# Patient Record
Sex: Female | Born: 1965 | Race: Black or African American | Hispanic: No | Marital: Married | Smoking: Never smoker
Health system: Southern US, Community
[De-identification: ages and names within clinical notes are randomized; demographics above are authoritative.]

## PROBLEM LIST (undated history)

## (undated) DIAGNOSIS — K921 Melena: Secondary | ICD-10-CM

## (undated) DIAGNOSIS — R011 Cardiac murmur, unspecified: Secondary | ICD-10-CM

## (undated) DIAGNOSIS — D649 Anemia, unspecified: Secondary | ICD-10-CM

## (undated) DIAGNOSIS — I10 Essential (primary) hypertension: Secondary | ICD-10-CM

## (undated) DIAGNOSIS — R7303 Prediabetes: Secondary | ICD-10-CM

## (undated) DIAGNOSIS — F419 Anxiety disorder, unspecified: Secondary | ICD-10-CM

## (undated) DIAGNOSIS — E669 Obesity, unspecified: Secondary | ICD-10-CM

## (undated) DIAGNOSIS — G4700 Insomnia, unspecified: Secondary | ICD-10-CM

## (undated) DIAGNOSIS — R928 Other abnormal and inconclusive findings on diagnostic imaging of breast: Secondary | ICD-10-CM

## (undated) DIAGNOSIS — A4902 Methicillin resistant Staphylococcus aureus infection, unspecified site: Secondary | ICD-10-CM

## (undated) HISTORY — DX: Insomnia, unspecified: G47.00

## (undated) HISTORY — PX: COLONOSCOPY: SHX174

## (undated) HISTORY — DX: Essential (primary) hypertension: I10

## (undated) HISTORY — DX: Obesity, unspecified: E66.9

## (undated) HISTORY — PX: TOTAL ABDOMINAL HYSTERECTOMY: SHX209

## (undated) HISTORY — DX: Prediabetes: R73.03

---
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
## 2000-03-26 ENCOUNTER — Other Ambulatory Visit: Admission: RE | Admit: 2000-03-26 | Discharge: 2000-03-26 | Payer: Self-pay | Admitting: Obstetrics and Gynecology

## 2001-05-23 ENCOUNTER — Other Ambulatory Visit: Admission: RE | Admit: 2001-05-23 | Discharge: 2001-05-23 | Payer: Self-pay | Admitting: Obstetrics and Gynecology

## 2003-06-11 ENCOUNTER — Other Ambulatory Visit: Admission: RE | Admit: 2003-06-11 | Discharge: 2003-06-11 | Payer: Self-pay | Admitting: Obstetrics and Gynecology

## 2005-08-20 ENCOUNTER — Other Ambulatory Visit: Admission: RE | Admit: 2005-08-20 | Discharge: 2005-08-20 | Payer: Self-pay | Admitting: Obstetrics and Gynecology

## 2005-09-01 ENCOUNTER — Ambulatory Visit (HOSPITAL_COMMUNITY): Admission: RE | Admit: 2005-09-01 | Discharge: 2005-09-01 | Payer: Self-pay | Admitting: Obstetrics and Gynecology

## 2005-09-01 ENCOUNTER — Encounter (INDEPENDENT_AMBULATORY_CARE_PROVIDER_SITE_OTHER): Payer: Self-pay | Admitting: *Deleted

## 2010-06-07 ENCOUNTER — Ambulatory Visit: Payer: Self-pay | Admitting: Oncology

## 2010-06-20 LAB — CBC & DIFF AND RETIC
BASO%: 0.6 % (ref 0.0–2.0)
Basophils Absolute: 0 10*3/uL (ref 0.0–0.1)
EOS%: 0.6 % (ref 0.0–7.0)
HGB: 8 g/dL — ABNORMAL LOW (ref 11.6–15.9)
Immature Retic Fract: 16.8 % — ABNORMAL HIGH (ref 0.00–10.70)
MCH: 18.1 pg — ABNORMAL LOW (ref 25.1–34.0)
MCHC: 27.9 g/dL — ABNORMAL LOW (ref 31.5–36.0)
RBC: 4.42 10*6/uL (ref 3.70–5.45)
RDW: 20.6 % — ABNORMAL HIGH (ref 11.2–14.5)
Retic %: 0.75 % (ref 0.50–1.50)
Retic Ct Abs: 33.15 10*3/uL (ref 18.30–72.70)
lymph#: 1.8 10*3/uL (ref 0.9–3.3)

## 2010-06-20 LAB — MORPHOLOGY: PLT EST: ADEQUATE

## 2010-06-20 LAB — CHCC SMEAR

## 2010-06-22 LAB — IRON AND TIBC: UIBC: 451 ug/dL

## 2010-06-22 LAB — COMPREHENSIVE METABOLIC PANEL
ALT: <8 U/L (ref 0–35)
AST: 11 U/L (ref 0–37)
Alkaline Phosphatase: 66 U/L (ref 39–117)
BUN: 6 mg/dL (ref 6–23)
Creatinine, Ser: 0.69 mg/dL (ref 0.40–1.20)
Total Bilirubin: 0.3 mg/dL (ref 0.3–1.2)

## 2010-06-22 LAB — TRANSFERRIN RECEPTOR, SOLUABLE: Transferrin Receptor, Soluble: 94.5 nmol/L

## 2010-07-04 LAB — CBC WITH DIFFERENTIAL/PLATELET
BASO%: 0.1 % (ref 0.0–2.0)
EOS%: 0.8 % (ref 0.0–7.0)
LYMPH%: 29 % (ref 14.0–49.7)
MCH: 21.1 pg — ABNORMAL LOW (ref 25.1–34.0)
MCHC: 30.2 g/dL — ABNORMAL LOW (ref 31.5–36.0)
MONO#: 0.3 10*3/uL (ref 0.1–0.9)
NEUT%: 65.3 % (ref 38.4–76.8)
RBC: 4.73 10*6/uL (ref 3.70–5.45)
WBC: 6.1 10*3/uL (ref 3.9–10.3)
lymph#: 1.8 10*3/uL (ref 0.9–3.3)

## 2010-07-04 LAB — FERRITIN: Ferritin: 466 ng/mL — ABNORMAL HIGH (ref 10–291)

## 2010-07-17 ENCOUNTER — Encounter: Admission: RE | Admit: 2010-07-17 | Discharge: 2010-07-17 | Payer: Self-pay | Admitting: Oncology

## 2010-07-17 IMAGING — MG MM DIGITAL SCREENING
4 series · 4 of 4 positions shown · non-contrast
Comparison: none

DG SCREEN MAMMOGRAM BILATERAL
Bilateral CC and MLO view(s) were taken.

DIGITAL SCREENING MAMMOGRAM WITH CAD:
The breast tissue is heterogeneously dense.  A possible mass is noted in the left breast.  Spot 
compression views and possibly sonography are recommended for further evaluation.  In the right 
breast, no masses or malignant type calcifications are identified.
Images were processed with CAD.

[R CC]
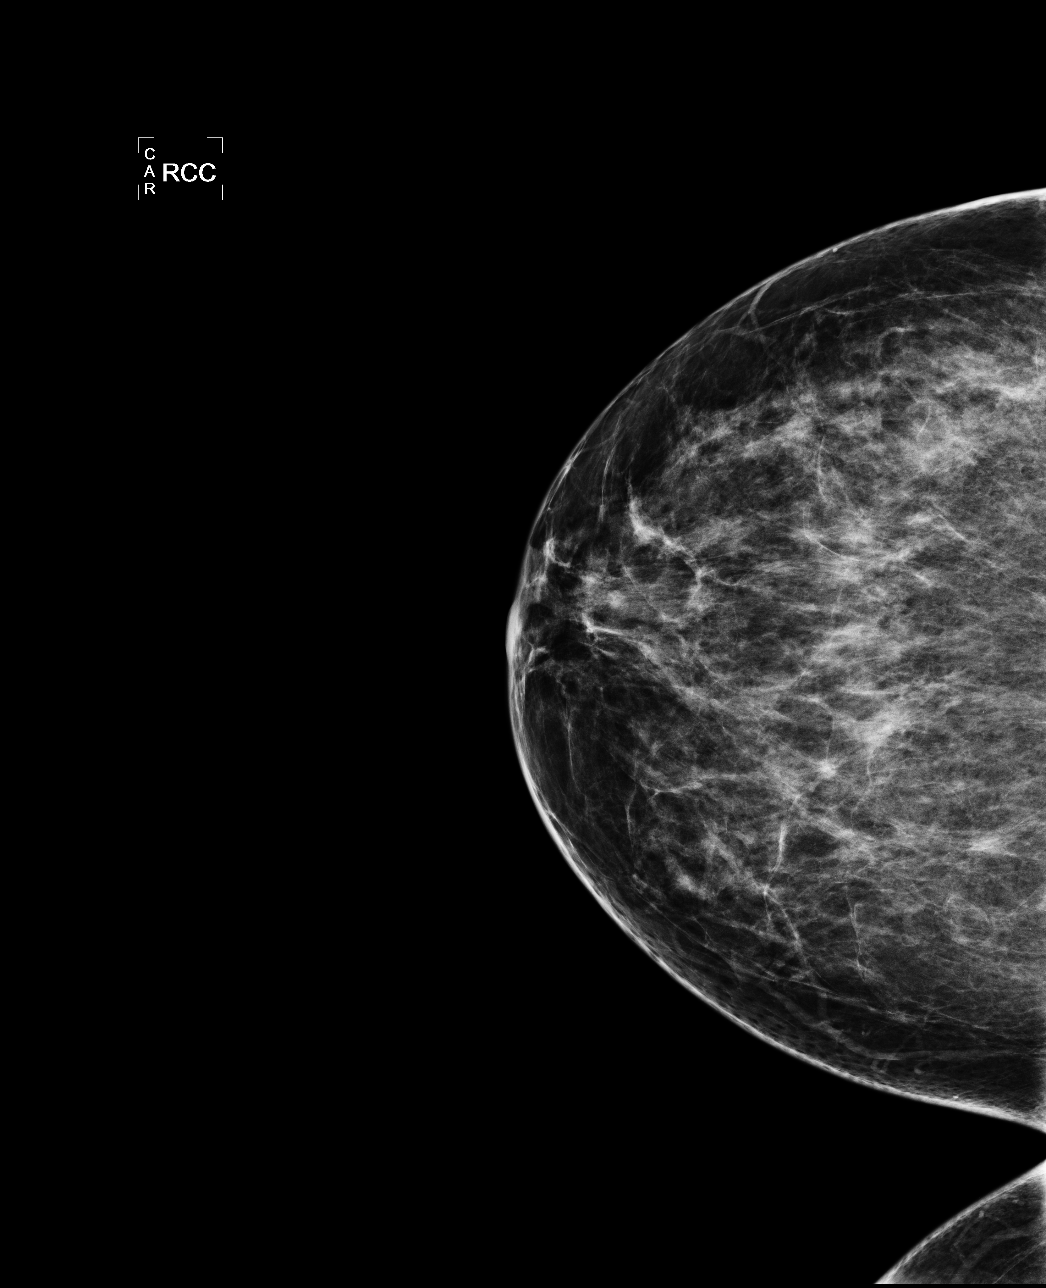

[L CC]
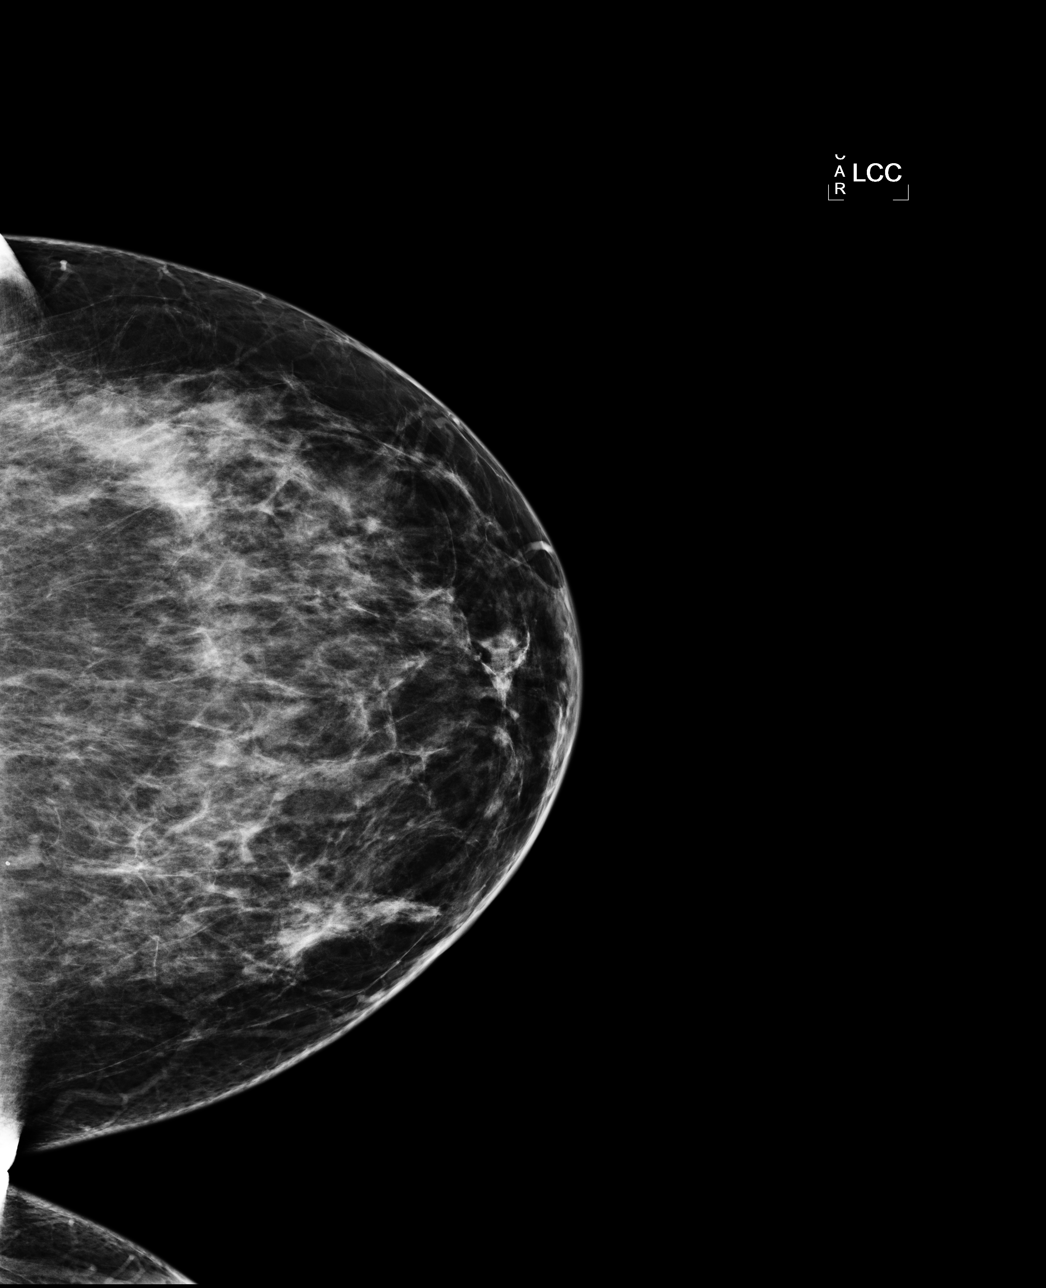

[L MLO]
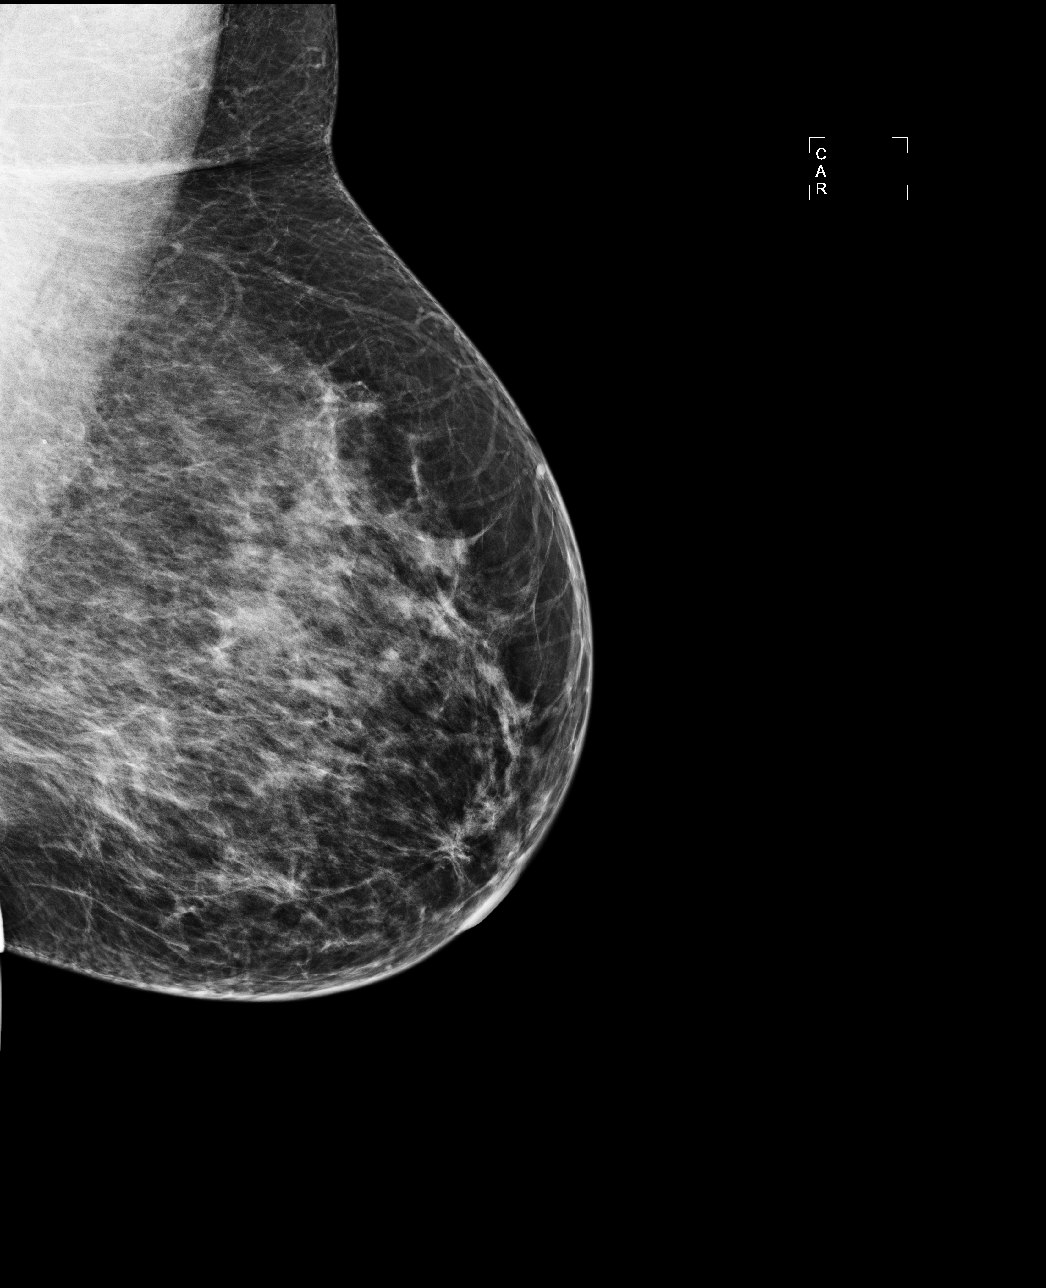

[R MLO]
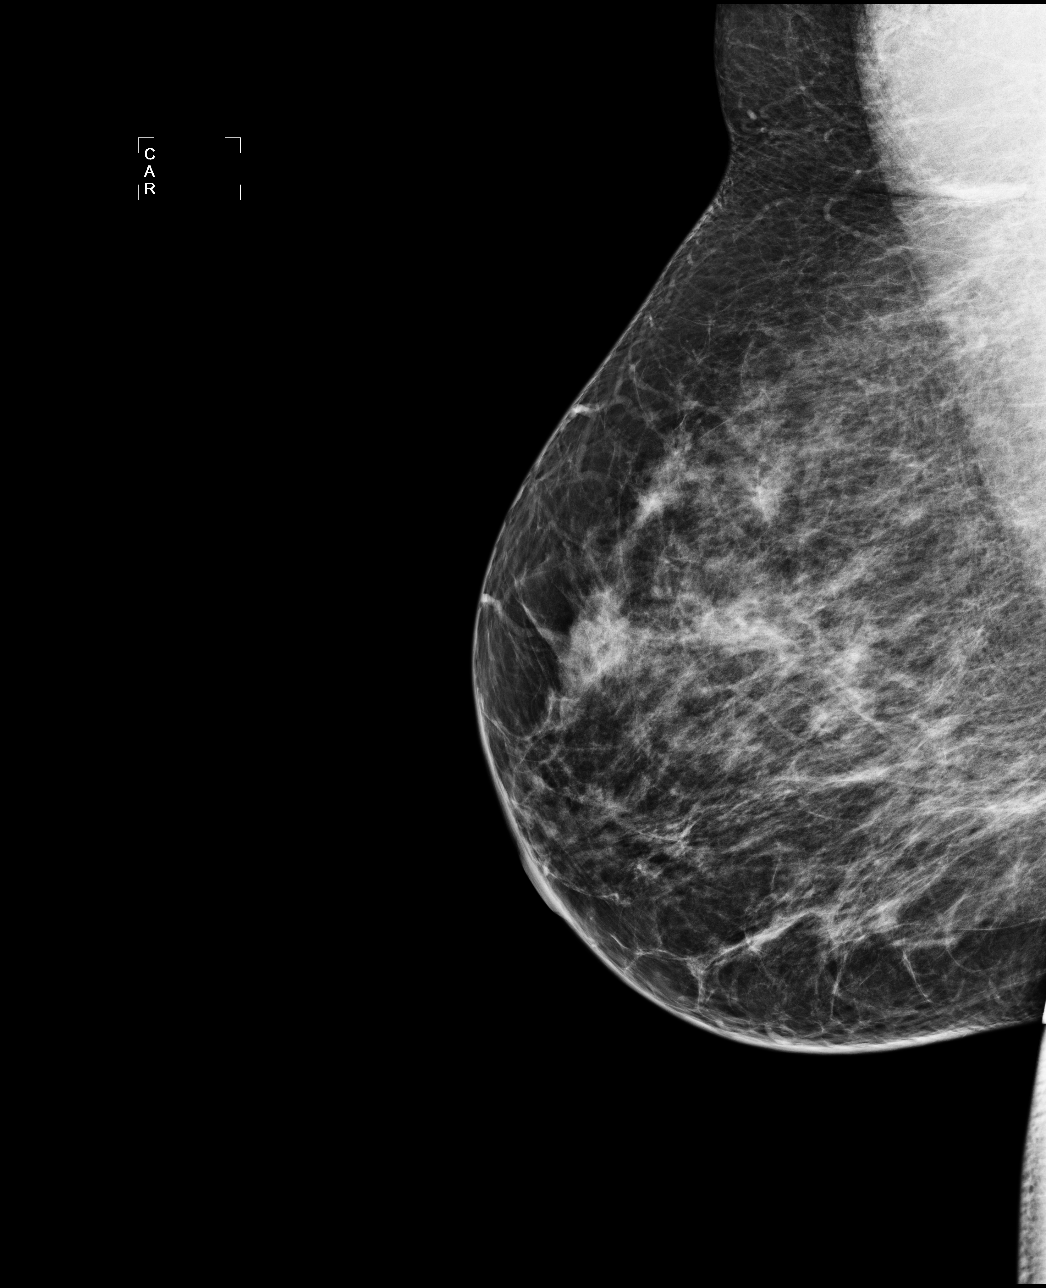

[4 of 4 positions shown; findings below may reference images not displayed]

IMPRESSION: Possible mass, left breast.  Additional evaluation is indicated.  The patient will be contacted for
additional studies and a supplementary report will follow.  No specific mammographic evidence of 
malignancy, right breast.

ASSESSMENT: Need additional imaging evaluation and/or prior mammograms for comparison - BI-RADS 0

Further imaging of the left breast.
,

## 2010-07-20 ENCOUNTER — Encounter: Admission: RE | Admit: 2010-07-20 | Discharge: 2010-07-20 | Payer: Self-pay | Admitting: Oncology

## 2010-07-21 ENCOUNTER — Ambulatory Visit: Payer: Self-pay | Admitting: Oncology

## 2010-11-26 ENCOUNTER — Encounter (HOSPITAL_COMMUNITY): Payer: Self-pay | Admitting: Oncology

## 2011-03-23 NOTE — Op Note (Signed)
NAMEOPEL, LEJEUNE            ACCOUNT NO.:  [id number]   MEDICAL RECORD NO.:  [id number]          PATIENT TYPE:  AMB   LOCATION:  SDC                           FACILITY:  [facility]   PHYSICIAN:  Janine Limbo, M.D.DATE OF BIRTH:  04/23/1966   DATE OF PROCEDURE:  09/01/2005  DATE OF DISCHARGE:                                 OPERATIVE REPORT   PREOPERATIVE DIAGNOSE:  1.  Menorrhagia.  2.  Anemia (hemoglobin is 10.8).   POSTOPERATIVE DIAGNOSES:  1.  Menorrhagia.  2.  Anemia (hemoglobin is 10.8).  3.  Endometrial polyp.   PROCEDURE:  1.  Hysteroscopy.  2.  Hysteroscopic resection of a polyp.  3.  Dilatation and curettage.   SURGEON:  Dr. Leonard Schwartz.   ANESTHETIC:  General.   DISPOSITION:  Ms. Brys is a 45 year old female, para 1-0-0-1, who presents  with the above-mentioned diagnosis. The patient understands the indications  for her surgical procedure and she accepts the risk of, but not limited to,  anesthetic complications, bleeding, infections, and possible damage to  surrounding organs. The patient has tried hormonal therapy but this has not  been successful. She does not desire hysterectomy.   FINDINGS:  The uterus is 10-12 week size. No adnexal masses were  appreciated. The endometrium and the cervix sounded to 11 cm. The patient  was found to have an endometrial polyp within the uterine cavity. Otherwise  there was lots of endometrial tissue, but no other pathology appreciated.   DESCRIPTION OF PROCEDURE:  The patient was taken to the operating room where  a general anesthetic was given. The patient's abdomen and perineum and  vagina were prepped with multiple layers of Betadine. The bladder was  drained of urine. Examination under anesthesia was performed. The patient  was sterilely draped. A paracervical block was placed using 10 mL of 0.5%  Marcaine with epinephrine. The patient was noted to have brisk bleeding from  her cervix. An endocervical  curettage was performed. The uterus sounded to  11 cm. The cervix was dilated. The diagnostic hysteroscope was inserted and  the endometrial cavity was carefully inspected. Pictures were taken. The  patient was noted to have a polyp present on the right lateral uterus. The  diagnostic hysteroscope was removed and the cervix was dilated even further.  The operative hysteroscope was then inserted. A single loop was used to  resect the polyp. Hemostasis was adequate. The cavity was then curetted  using a medium sharp curette until the cavity was felt to be clean.  Hemostasis was noted to be adequate. We felt we were ready to the end our  procedure. Instruments were removed. Examination was repeated and the uterus  was noted to be without harm. Sponge, needle, and instrument counts were  correct on two occasions. The estimated blood loss for the procedure was 20  mL. The estimated fluid deficit was 50 mL. The patient tolerated her  procedure well. She was awakened from her anesthetic and taken to the  recovery room in stable condition.   FOLLOW-UP INSTRUCTIONS:  The patient was given a prescription for Darvocet  and she will take 1 or 2 tablets every 4 hours as needed for pain. She will  return to see Dr. Stefano Gaul in 3-4 weeks for a follow-up examination. She was  given a copy of the postoperative instruction sheet as prepared by the  [facility] for patients who have undergone a dilatation  and curettage.      Janine Limbo, M.D.  Electronically Signed     AVS/MEDQ  D:  09/01/2005  T:  09/01/2005  Job:  [id number]

## 2011-03-23 NOTE — H&P (Signed)
Paula Crawford, Paula Crawford            ACCOUNT NO.:  [id number]   MEDICAL RECORD NO.:  [id number]          PATIENT TYPE:  AMB   LOCATION:  SDC                           FACILITY:  [facility]   PHYSICIAN:  Janine Limbo, M.D.DATE OF BIRTH:  Apr 30, 1966   DATE OF ADMISSION:  09/01/2005  DATE OF DISCHARGE:                                HISTORY & PHYSICAL   HISTORY OF PRESENT ILLNESS:  Paula Crawford is a 45 year old female, para 1, 0,  0, 1, who presents for hysteroscopy and dilatation and curettage. The  patient has been followed at the [facility]. The patient has a history of  menorrhagia and anemia. Her most recent hemoglobin was 10.5 (August 20, 2005). The patient has been treated with hormonal therapy but reports having  increased bleeding recently. An ultrasound was performed on August 31, 2005  that showed a 10.7 x 5.8 x 5.0 cm uterus. The patient was found to have a  thickened endometrium with a hypoechoic mass measuring 1.5 x 1.2 cm. This  was thought to be consistent with an endometrial polyp. The patient's most  recent Pap smear was within normal limits.   OBSTETRICAL HISTORY:  The patient has had one term cesarean delivery.   PAST MEDICAL HISTORY:  The patient denies hypertension and diabetes.   ALLERGIES:  No known drug allergies.   SOCIAL HISTORY:  The patient denies cigarette use, alcohol use, and  recreational drug use.   REVIEW OF SYSTEMS:  Noncontributory.   FAMILY HISTORY:  The patient's maternal grandfather has hypertension. Her  maternal grandmother had a stroke.   PHYSICAL EXAMINATION:  Weight is 188 pounds. Height is 5 feet, 1 inch.  HEENT:  Within normal limits.  CHEST:  Clear.  HEART:  Regular rate and rhythm.  BREASTS:  Are without masses.  ABDOMEN:  Nontender.  EXTREMITIES:  Within normal limits.  NEUROLOGIC EXAM:  Grossly normal.  PELVIC EXAM:  External genitalia is normal. Vagina  is normal. Cervix is  nontender. Uterus is 10-12 weeks size but difficult to examine secondary to  body habitus. Adnexa no masses. Rectovaginal exam confirms.   ASSESSMENT:  1.  Menorrhagia.  2.  Anemia.  3.  Questionable endometrial polyp.   PLAN:  The patient elects to proceed with hysteroscopy with dilation and  curettage.      Janine Limbo, M.D.  Electronically Signed     AVS/MEDQ  D:  08/31/2005  T:  08/31/2005  Job:  [id number]

## 2012-11-05 HISTORY — PX: TOTAL ABDOMINAL HYSTERECTOMY: SHX209

## 2013-04-21 ENCOUNTER — Other Ambulatory Visit: Payer: Self-pay | Admitting: Obstetrics and Gynecology

## 2013-06-17 ENCOUNTER — Other Ambulatory Visit: Payer: Self-pay | Admitting: Obstetrics and Gynecology

## 2013-06-29 ENCOUNTER — Other Ambulatory Visit: Payer: Self-pay | Admitting: Obstetrics and Gynecology

## 2013-06-29 ENCOUNTER — Encounter (HOSPITAL_COMMUNITY): Payer: Self-pay

## 2013-06-29 NOTE — H&P (Signed)
Paula Crawford is a 47 YO  female P 1-0-0-1   who  presents for hysterectomy because of symptomatic uterine fibroids, menorrhagia and anemia.  For over five years the patient has had heavy menses but in the past six months she states that her five day flow has become unbearable.  She has to change a super tampon along with two pads hourly and experiences severe back "cramping" rated at a 10/10 on a 10 point pain scale.  The patient has used heat and Midol for her pain however, relief is only to an 8/10 rating.  She denies intermenstrual bleeding, dyspareunia, vaginitis symptoms, bladder problems or changes in her bowel movements.  An endometrial biopsy in June 2014 showed superficial fragments of endometrial glands with no evidence of atypia or malignancy.  A sonohysterogram at that same time revealed: uterus- 13.8 x 10.2 x 10.0 with a subserosal  right anterior fibroid: 7.4 x 6.4 x 7.3 cm and left intramural fundal 3.2 x 3.6 x 3.9 cm, endometrium = 0.866 cm; right ovary-4.3 x 2.7 x 2.6 cm and left ovary-5.5 x 5.5 x 4.37 cm with a simple cyst - 4.4 x 4.2 x 3.9 cm. Normal labs included a comprehensive metabolic panel, THS and Hgb A1C however, the patient's hemoglobin was 9.4.  A review of both medical and surgical management options were given to the patient but after careful consideration,  she has chosen definitive therapy in the form of hysterectomy.  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 ferrous sulfate 324 (65 FE) MG TBEC Take 2 tablets by mouth daily.      . Melatonin 5 MG TABS Take 1 tablet by mouth at bedtime.       No Known Allergies  Denies sensitivity to peanuts, shellfish, soy, latex or adhesives.   ROS: Admits to wearing glasses but denies headache, vision changes, nasal congestion, dysphagia, tinnitus, dizziness, hoarseness, cough,  chest pain, shortness of breath, nausea, vomiting, diarrhea,constipation,  urinary frequency, urgency  dysuria, hematuria, vaginitis symptoms, pelvic pain, swelling of joints,easy bruising,  myalgias, arthralgias, skin rashes, unexplained weight loss and except as is mentioned in the history of present illness, patient's review of systems is otherwise negative.  Physical Exam  Bp: 126/70    R: 16   Temp.: 98.4 degrees F orally    Weight: 209 lbs.  Height: 5'1"  Neck: supple without masses or thyromegaly Lungs: clear to auscultation Heart: regular rate and rhythm Abdomen: soft, non-tender with a palpable firm mass from pelvis approximately 3 fingers above symphysis pubis and no organomegaly Pelvic:EGBUS- wnl; vagina-normal rugae; uterus-12-14 weeks size and irregular (exam limited by habitus) , cervix without lesions or motion tenderness; adnexae-no tenderness or masses Extremities:  no  clubbing, cyanosis or edema   Assesment:  Symptomatic Uterine Fibroids                        Menorrhagia                        Anemia   Disposition:  A discussion was held with patient regarding the indication for her procedure(s) along with the risks, which include but are not limited to: reaction to anesthesia, damage to adjacent organs, infection and excessive bleeding. The patient  verbalized understanding of these risks and has consented to proceed with a Total Abdominal Hysterectomy with Bilateral Salpingectomy at [facility] on July 08, 2013 at 7:30 a.m.   CSN# [id number]   Rimsha Trembley J. Lowell Guitar, PA-C  for Dr. Maris Berger. Haygood

## 2013-07-01 ENCOUNTER — Other Ambulatory Visit: Payer: Self-pay | Admitting: Obstetrics and Gynecology

## 2013-07-03 ENCOUNTER — Encounter (HOSPITAL_COMMUNITY): Payer: Self-pay

## 2013-07-03 ENCOUNTER — Encounter (HOSPITAL_COMMUNITY)
Admission: RE | Admit: 2013-07-03 | Discharge: 2013-07-03 | Disposition: A | Discharge status: Home / Self Care | Payer: BC Managed Care – PPO | Source: Ambulatory Visit | Attending: Obstetrics and Gynecology | Admitting: Obstetrics and Gynecology

## 2013-07-03 DIAGNOSIS — Z01812 Encounter for preprocedural laboratory examination: Secondary | ICD-10-CM | POA: Insufficient documentation

## 2013-07-03 DIAGNOSIS — Z01818 Encounter for other preprocedural examination: Secondary | ICD-10-CM | POA: Insufficient documentation

## 2013-07-03 HISTORY — DX: Anemia, unspecified: D64.9

## 2013-07-03 HISTORY — DX: Anxiety disorder, unspecified: F41.9

## 2013-07-03 HISTORY — DX: Cardiac murmur, unspecified: R01.1

## 2013-07-03 LAB — CBC
Hemoglobin: 10.8 g/dL — ABNORMAL LOW (ref 12.0–15.0)
MCH: 24.7 pg — ABNORMAL LOW (ref 26.0–34.0)
MCHC: 31 g/dL (ref 30.0–36.0)
Platelets: 347 10*3/uL (ref 150–400)

## 2013-07-03 NOTE — Patient Instructions (Addendum)
Your procedure is scheduled on: 07/08/2013  Enter through the Main Entrance of [facility] at: 0600am  Pick up the phone at the desk and dial [phone].  Call this number if you have problems the morning of surgery: [phone].  Remember: Do NOT eat food: after midnight 07/07/2013 Do NOT drink clear liquids after: after midnight 07/07/2013 Take these medicines the morning of surgery with a SIP OF WATER: n/a  Do NOT wear jewelry, make-up, or nail polish. Do NOT wear lotions, powders, or perfumes.  You may wear deordrant. Do NOT shave for 48 hours prior to surgery. Do NOT bring valuables to the hospital. Contacts, dentures, or bridgework may not be worn into surgery. Leave suitcase in car.  After surgery it may be brought to your room.  For patients admitted to the hospital, checkout time is 11:00 AM the day of discharge.

## 2013-07-07 MED ORDER — DEXTROSE 5 % IV SOLN
2.0000 g | INTRAVENOUS | Status: AC
  Administered 2013-07-08: 2 g via INTRAVENOUS
  Filled 2013-07-07: qty 2

## 2013-07-08 ENCOUNTER — Encounter (HOSPITAL_COMMUNITY): Payer: Self-pay | Admitting: Anesthesiology

## 2013-07-08 ENCOUNTER — Inpatient Hospital Stay (HOSPITAL_COMMUNITY)
Admission: RE | Admit: 2013-07-08 | Discharge: 2013-07-10 | DRG: 358 | Disposition: A | Discharge status: Home / Self Care | Payer: BC Managed Care – PPO | Source: Ambulatory Visit | Attending: Obstetrics and Gynecology | Admitting: Obstetrics and Gynecology

## 2013-07-08 ENCOUNTER — Inpatient Hospital Stay (HOSPITAL_COMMUNITY): Payer: BC Managed Care – PPO | Admitting: Anesthesiology

## 2013-07-08 ENCOUNTER — Encounter (HOSPITAL_COMMUNITY)
Admission: RE | Disposition: A | Discharge status: Home / Self Care | Payer: Self-pay | Source: Ambulatory Visit | Attending: Obstetrics and Gynecology

## 2013-07-08 DIAGNOSIS — M545 Low back pain, unspecified: Secondary | ICD-10-CM | POA: Diagnosis present

## 2013-07-08 DIAGNOSIS — D251 Intramural leiomyoma of uterus: Secondary | ICD-10-CM | POA: Diagnosis present

## 2013-07-08 DIAGNOSIS — R011 Cardiac murmur, unspecified: Secondary | ICD-10-CM | POA: Diagnosis present

## 2013-07-08 DIAGNOSIS — D252 Subserosal leiomyoma of uterus: Principal | ICD-10-CM | POA: Diagnosis present

## 2013-07-08 DIAGNOSIS — D500 Iron deficiency anemia secondary to blood loss (chronic): Secondary | ICD-10-CM | POA: Diagnosis present

## 2013-07-08 DIAGNOSIS — N838 Other noninflammatory disorders of ovary, fallopian tube and broad ligament: Secondary | ICD-10-CM | POA: Diagnosis present

## 2013-07-08 DIAGNOSIS — N920 Excessive and frequent menstruation with regular cycle: Secondary | ICD-10-CM | POA: Diagnosis present

## 2013-07-08 DIAGNOSIS — N83209 Unspecified ovarian cyst, unspecified side: Secondary | ICD-10-CM | POA: Diagnosis present

## 2013-07-08 DIAGNOSIS — D219 Benign neoplasm of connective and other soft tissue, unspecified: Secondary | ICD-10-CM | POA: Diagnosis present

## 2013-07-08 HISTORY — PX: ABDOMINAL HYSTERECTOMY: SHX81

## 2013-07-08 LAB — PREGNANCY, URINE: Preg Test, Ur: NEGATIVE

## 2013-07-08 SURGERY — HYSTERECTOMY, ABDOMINAL
Anesthesia: General | Site: Abdomen | Wound class: Clean Contaminated

## 2013-07-08 MED ORDER — KETOROLAC TROMETHAMINE 30 MG/ML IJ SOLN
15.0000 mg | Freq: Once | INTRAMUSCULAR | Status: AC | PRN
  Administered 2013-07-08: 30 mg via INTRAVENOUS

## 2013-07-08 MED ORDER — MIDAZOLAM HCL 2 MG/2ML IJ SOLN
INTRAMUSCULAR | Status: AC
  Filled 2013-07-08: qty 2

## 2013-07-08 MED ORDER — KETOROLAC TROMETHAMINE 30 MG/ML IJ SOLN
INTRAMUSCULAR | Status: AC
  Filled 2013-07-08: qty 1

## 2013-07-08 MED ORDER — LIDOCAINE 5 % EX PTCH
1.0000 | MEDICATED_PATCH | CUTANEOUS | Status: DC
  Administered 2013-07-08: 1 via TRANSDERMAL
  Filled 2013-07-08: qty 1

## 2013-07-08 MED ORDER — ONDANSETRON HCL 4 MG/2ML IJ SOLN
INTRAMUSCULAR | Status: AC
  Filled 2013-07-08: qty 2

## 2013-07-08 MED ORDER — MENTHOL 3 MG MT LOZG: 1.0000 | LOZENGE | OROMUCOSAL | Status: DC | PRN

## 2013-07-08 MED ORDER — LIDOCAINE HCL (CARDIAC) 20 MG/ML IV SOLN
INTRAVENOUS | Status: DC | PRN
  Administered 2013-07-08: 80 mg via INTRAVENOUS

## 2013-07-08 MED ORDER — NALOXONE HCL 0.4 MG/ML IJ SOLN: 0.4000 mg | INTRAMUSCULAR | Status: DC | PRN

## 2013-07-08 MED ORDER — LACTATED RINGERS IV SOLN
INTRAVENOUS | Status: DC
  Administered 2013-07-08 (×2): via INTRAVENOUS

## 2013-07-08 MED ORDER — HYDROMORPHONE HCL PF 1 MG/ML IJ SOLN
INTRAMUSCULAR | Status: DC | PRN
  Administered 2013-07-08: 1 mg via INTRAVENOUS

## 2013-07-08 MED ORDER — ACETAMINOPHEN 160 MG/5ML PO SOLN
ORAL | Status: AC
  Administered 2013-07-08: 975 mg via ORAL
  Filled 2013-07-08: qty 40.6

## 2013-07-08 MED ORDER — GLYCOPYRROLATE 0.2 MG/ML IJ SOLN
INTRAMUSCULAR | Status: AC
  Filled 2013-07-08: qty 1

## 2013-07-08 MED ORDER — GLYCOPYRROLATE 0.2 MG/ML IJ SOLN
INTRAMUSCULAR | Status: DC | PRN
  Administered 2013-07-08: 0.1 mg via INTRAVENOUS
  Administered 2013-07-08: .8 mg via INTRAVENOUS

## 2013-07-08 MED ORDER — ACETAMINOPHEN 160 MG/5ML PO SOLN
975.0000 mg | Freq: Once | ORAL | Status: AC
  Administered 2013-07-08: 975 mg via ORAL

## 2013-07-08 MED ORDER — BUPIVACAINE HCL (PF) 0.25 % IJ SOLN
INTRAMUSCULAR | Status: AC
  Filled 2013-07-08: qty 30

## 2013-07-08 MED ORDER — ARTIFICIAL TEARS OP OINT
TOPICAL_OINTMENT | OPHTHALMIC | Status: AC
  Filled 2013-07-08: qty 3.5

## 2013-07-08 MED ORDER — HYDROMORPHONE HCL PF 1 MG/ML IJ SOLN
INTRAMUSCULAR | Status: AC
  Filled 2013-07-08: qty 1

## 2013-07-08 MED ORDER — FENTANYL CITRATE 0.05 MG/ML IJ SOLN
INTRAMUSCULAR | Status: AC
  Filled 2013-07-08: qty 5

## 2013-07-08 MED ORDER — METOCLOPRAMIDE HCL 5 MG/ML IJ SOLN: 10.0000 mg | Freq: Once | INTRAMUSCULAR | Status: DC | PRN

## 2013-07-08 MED ORDER — PROPOFOL 10 MG/ML IV BOLUS
INTRAVENOUS | Status: DC | PRN
  Administered 2013-07-08: 200 mg via INTRAVENOUS

## 2013-07-08 MED ORDER — ONDANSETRON HCL 4 MG PO TABS
4.0000 mg | ORAL_TABLET | Freq: Three times a day (TID) | ORAL | Status: DC | PRN
  Administered 2013-07-09 – 2013-07-10 (×2): 4 mg via ORAL
  Filled 2013-07-08 (×2): qty 1

## 2013-07-08 MED ORDER — NEOSTIGMINE METHYLSULFATE 1 MG/ML IJ SOLN
INTRAMUSCULAR | Status: DC | PRN
  Administered 2013-07-08: 4 mg via INTRAVENOUS

## 2013-07-08 MED ORDER — ONDANSETRON HCL 4 MG/2ML IJ SOLN: 4.0000 mg | Freq: Four times a day (QID) | INTRAMUSCULAR | Status: DC | PRN

## 2013-07-08 MED ORDER — NEOSTIGMINE METHYLSULFATE 1 MG/ML IJ SOLN
INTRAMUSCULAR | Status: AC
  Filled 2013-07-08: qty 1

## 2013-07-08 MED ORDER — IBUPROFEN 600 MG PO TABS
600.0000 mg | ORAL_TABLET | Freq: Four times a day (QID) | ORAL | Status: DC | PRN
  Administered 2013-07-08: 600 mg via ORAL
  Filled 2013-07-08 (×2): qty 1

## 2013-07-08 MED ORDER — BUPIVACAINE HCL (PF) 0.25 % IJ SOLN
INTRAMUSCULAR | Status: DC | PRN
  Administered 2013-07-08: 10 mL

## 2013-07-08 MED ORDER — DIPHENHYDRAMINE HCL 12.5 MG/5ML PO ELIX: 12.5000 mg | ORAL_SOLUTION | Freq: Four times a day (QID) | ORAL | Status: DC | PRN

## 2013-07-08 MED ORDER — HYDROMORPHONE HCL PF 1 MG/ML IJ SOLN
0.2500 mg | INTRAMUSCULAR | Status: DC | PRN
  Administered 2013-07-08 (×2): 0.5 mg via INTRAVENOUS

## 2013-07-08 MED ORDER — SODIUM CHLORIDE 0.9 % IJ SOLN: 9.0000 mL | INTRAMUSCULAR | Status: DC | PRN

## 2013-07-08 MED ORDER — ROCURONIUM BROMIDE 100 MG/10ML IV SOLN
INTRAVENOUS | Status: DC | PRN
  Administered 2013-07-08: 50 mg via INTRAVENOUS
  Administered 2013-07-08 (×2): 10 mg via INTRAVENOUS
  Administered 2013-07-08 (×4): 5 mg via INTRAVENOUS

## 2013-07-08 MED ORDER — OXYCODONE-ACETAMINOPHEN 5-325 MG PO TABS
1.0000 | ORAL_TABLET | ORAL | Status: DC | PRN
Start: 2013-07-08 — End: 2013-07-10
  Administered 2013-07-09 (×4): 2 via ORAL
  Filled 2013-07-08 (×4): qty 2

## 2013-07-08 MED ORDER — ONDANSETRON HCL 4 MG/2ML IJ SOLN
INTRAMUSCULAR | Status: DC | PRN
  Administered 2013-07-08: 4 mg via INTRAVENOUS

## 2013-07-08 MED ORDER — HYDROMORPHONE 0.3 MG/ML IV SOLN
INTRAVENOUS | Status: DC
  Administered 2013-07-08: 6 mg via INTRAVENOUS
  Administered 2013-07-08: 13:00:00 via INTRAVENOUS
  Filled 2013-07-08: qty 25

## 2013-07-08 MED ORDER — LIDOCAINE HCL (CARDIAC) 20 MG/ML IV SOLN
INTRAVENOUS | Status: AC
  Filled 2013-07-08: qty 5

## 2013-07-08 MED ORDER — FENTANYL CITRATE 0.05 MG/ML IJ SOLN
INTRAMUSCULAR | Status: DC | PRN
  Administered 2013-07-08: 50 ug via INTRAVENOUS
  Administered 2013-07-08: 100 ug via INTRAVENOUS
  Administered 2013-07-08 (×7): 50 ug via INTRAVENOUS

## 2013-07-08 MED ORDER — DEXAMETHASONE SODIUM PHOSPHATE 10 MG/ML IJ SOLN
INTRAMUSCULAR | Status: DC | PRN
  Administered 2013-07-08: 10 mg via INTRAVENOUS

## 2013-07-08 MED ORDER — PROPOFOL 10 MG/ML IV EMUL
INTRAVENOUS | Status: AC
  Filled 2013-07-08: qty 20

## 2013-07-08 MED ORDER — MIDAZOLAM HCL 2 MG/2ML IJ SOLN
INTRAMUSCULAR | Status: DC | PRN
  Administered 2013-07-08 (×2): 1 mg via INTRAVENOUS

## 2013-07-08 MED ORDER — DIPHENHYDRAMINE HCL 50 MG/ML IJ SOLN: 12.5000 mg | Freq: Four times a day (QID) | INTRAMUSCULAR | Status: DC | PRN

## 2013-07-08 MED ORDER — ROCURONIUM BROMIDE 50 MG/5ML IV SOLN
INTRAVENOUS | Status: AC
  Filled 2013-07-08: qty 1

## 2013-07-08 MED ORDER — DEXAMETHASONE SODIUM PHOSPHATE 10 MG/ML IJ SOLN
INTRAMUSCULAR | Status: AC
  Filled 2013-07-08: qty 1

## 2013-07-08 MED ORDER — LACTATED RINGERS IV SOLN
INTRAVENOUS | Status: DC
  Administered 2013-07-08 (×3): via INTRAVENOUS

## 2013-07-08 MED ORDER — GLYCOPYRROLATE 0.2 MG/ML IJ SOLN
INTRAMUSCULAR | Status: AC
  Filled 2013-07-08: qty 3

## 2013-07-08 SURGICAL SUPPLY — 50 items
ADH SKN CLS APL DERMABOND .7 (GAUZE/BANDAGES/DRESSINGS)
CANISTER SUCTION 2500CC (MISCELLANEOUS) ×2 IMPLANT
CATH ROBINSON RED A/P 16FR (CATHETERS) IMPLANT
CHLORAPREP W/TINT 26ML (MISCELLANEOUS) ×2 IMPLANT
CLOTH BEACON ORANGE TIMEOUT ST (SAFETY) ×2 IMPLANT
CONT PATH 16OZ SNAP LID 3702 (MISCELLANEOUS) ×2 IMPLANT
CONT SPECI 4OZ STER CLIK (MISCELLANEOUS) IMPLANT
DECANTER SPIKE VIAL GLASS SM (MISCELLANEOUS) ×1 IMPLANT
DERMABOND ADVANCED (GAUZE/BANDAGES/DRESSINGS)
DERMABOND ADVANCED .7 DNX12 (GAUZE/BANDAGES/DRESSINGS) IMPLANT
DRAIN JACKSON PRT FLT 7MM (DRAIN) IMPLANT
DRSG OPSITE POSTOP 4X10 (GAUZE/BANDAGES/DRESSINGS) ×1 IMPLANT
ELECT CAUTERY BLADE 6.4 (BLADE) IMPLANT
ELECT NDL TIP 2.8 STRL (NEEDLE) IMPLANT
ELECT NEEDLE TIP 2.8 STRL (NEEDLE) IMPLANT
EVACUATOR SILICONE 100CC (DRAIN) IMPLANT
GAUZE SPONGE 4X4 16PLY XRAY LF (GAUZE/BANDAGES/DRESSINGS) ×2 IMPLANT
GLOVE SURG SS PI 6.5 STRL IVOR (GLOVE) ×4 IMPLANT
GOWN PREVENTION PLUS LG XLONG (DISPOSABLE) ×6 IMPLANT
NDL HYPO 25X1 1.5 SAFETY (NEEDLE) IMPLANT
NDL SPNL 22GX3.5 QUINCKE BK (NEEDLE) ×1 IMPLANT
NEEDLE HYPO 25X1 1.5 SAFETY (NEEDLE) IMPLANT
NEEDLE SPNL 22GX3.5 QUINCKE BK (NEEDLE) ×2 IMPLANT
NS IRRIG 1000ML POUR BTL (IV SOLUTION) ×2 IMPLANT
PACK ABDOMINAL GYN (CUSTOM PROCEDURE TRAY) ×2 IMPLANT
PAD ABD 7.5X8 STRL (GAUZE/BANDAGES/DRESSINGS) ×1 IMPLANT
PAD OB MATERNITY 4.3X12.25 (PERSONAL CARE ITEMS) ×2 IMPLANT
PROTECTOR NERVE ULNAR (MISCELLANEOUS) ×2 IMPLANT
SPONGE LAP 18X18 X RAY DECT (DISPOSABLE) ×4 IMPLANT
STAPLER VISISTAT 35W (STAPLE) IMPLANT
SUT MNCRL AB 3-0 PS2 27 (SUTURE) ×1 IMPLANT
SUT PDS AB 1 CT  36 (SUTURE)
SUT PDS AB 1 CT 36 (SUTURE) IMPLANT
SUT PLAIN 2 0 XLH (SUTURE) IMPLANT
SUT SILK 0 FSL (SUTURE) IMPLANT
SUT VIC AB 0 CT1 18XCR BRD8 (SUTURE) ×3 IMPLANT
SUT VIC AB 0 CT1 27 (SUTURE) ×6
SUT VIC AB 0 CT1 27XBRD ANBCTR (SUTURE) IMPLANT
SUT VIC AB 0 CT1 8-18 (SUTURE) ×8
SUT VIC AB 2-0 CT1 (SUTURE) ×2 IMPLANT
SUT VIC AB 3-0 SH 27 (SUTURE)
SUT VIC AB 3-0 SH 27X BRD (SUTURE) IMPLANT
SUT VICRYL 0 TIES 12 18 (SUTURE) ×2 IMPLANT
SYR 50ML LL SCALE MARK (SYRINGE) IMPLANT
SYR CONTROL 10ML LL (SYRINGE) ×1 IMPLANT
SYR TB 1ML 25GX5/8 (SYRINGE) IMPLANT
TAPE CLOTH SURG 4X10 WHT LF (GAUZE/BANDAGES/DRESSINGS) ×1 IMPLANT
TOWEL OR 17X24 6PK STRL BLUE (TOWEL DISPOSABLE) ×4 IMPLANT
TRAY FOLEY CATH 14FR (SET/KITS/TRAYS/PACK) ×2 IMPLANT
WATER STERILE IRR 1000ML POUR (IV SOLUTION) ×2 IMPLANT

## 2013-07-08 NOTE — Op Note (Signed)
07/08/2013  11:56 AM  PATIENT:  Paula Crawford  47 y.o. female MRN:  [id number]  PRE-OPERATIVE DIAGNOSIS:  Firboids, Anemia, Menorrhagia, status post 2 cesarean sections  POST-OPERATIVE DIAGNOSIS:  Firboids, Anemia, Menorrhagia, status post 2 cesarean sections, and if he can't  PROCEDURE:  Procedure(s): Total abdominal hysterectomy Bilateral salpingectomy  SURGEON:  Surgeon(s): Hal Morales, MD  ASSISTANTS: Henreitta Leber certified physician assistant   ANESTHESIA:  General  ESTIMATED BLOOD LOSS: 300 cc  COMPLICATIONS: None  BLOOD ADMINISTERED:none  DRAINS: none   LOCAL MEDICATIONS USED:  BUPIVICAINE   SPECIMEN:  Source of Specimen:  Uterine fundus with tubes attached bilaterally, uterine cervix  DISPOSITION OF SPECIMEN:  PATHOLOGY  COUNTS:  YES  PROCEDURE: The patient was taken to the operating room after appropriate identification and placed on the operating table in the supine position. Equipment for the induction of general anesthesia was placed and after the attainment of adequate general anesthesia the  perineum, and vagina were prepped with multiple layers of Betadine and a Foley catheter was inserted into the bladder under sterile conditions and connected to straight drainage. The abdomen was prepped with chlor prep, which was allowed to drive for 3 minutes, then the abdomen was draped as a sterile field.  Suprapubic injection of 10 cc of quarter percent Marcaine was undertaken. A suprapubic incision was made and the abdomen opened in layers. Peritoneum was entered and a self-retaining retractor placed in the abdominal cavity. A bladder blade was placed. After visual and palpable intraperitoneal evaluation of the anatomy was carried, the bowel was packed cephalad.  Large Kelly clamps were placed at the cornual regions of the uterus. The left round ligament was then identified clamped cut and suture ligated. That incision was taken anteriorly on the anterior leaf of  the broad ligament. The ovarian attachment to the fallopian tube was clamped, cut, and suture ligated. The utero-ovarian ligament and remaining mesial salpinx were  identified clamped cut, free tied and suture-ligated. A similar procedure was carried out on the opposite side with the round ligament, fallopian tube,  and utero-ovarian ligament. The bladder flap was completed on the opposite side with incision of the anterior leaf of the broad ligament. The bladder was dissected off the anterior cervix with a combination of blunt and sharp dissection. The uterine arteries on the right and left side were skeletonized, then clamped cut and suture ligated. The uterine fundus was then excised from the cervix.  Meticulous dissection of the bladder off the anterior cervix was then undertaken down to the level of the cervicovaginal junction. Care was taken to insure that the dissection was at the level of the pubo- cervical fascia to avoid bladder injury.. The para-cervical tissues were then clamped cut and suture ligated on the right and left sides. Uterosacral ligaments were clamped cut suture-ligated and those sutures held. The vaginal angles were clamped cut suture ligated and the sutures held. The remainder of the vagina was incised and the  cervix were removed from the operative field. Vaginal cuff was closed with figure-of-eight suturres.  All sutures used tot this point were 0 Vicryl.. Copious irrigation was carried out. The sutures holding the vaginal angles and uterosacral ligaments were then tied together. Hemostasis was noted to be adequate and all instruments were removed from the peritoneal cavity.  The abdominal peritoneum was closed using running suture of 2-0 Vicryl.  The rectus fascia was closed with running sutures of 0 Vicryl from each apex to  the midline and tied in  the midline. The subcutaneous tissue was made hemostatic with Bovie cautery and irrigated. The skin incision was closed with a  subcuticular suture of 3-0 Monocryl. Sterile dressing was applied. The patient was awakened from general anesthesia and taken to the recovery room in satisfactory condition having tolerated the procedure well with  sponge and instrument counts correct.  PLAN OF CARE: Postoperative admission  PATIENT DISPOSITION:  PACU - hemodynamically stable.   Delay start of Pharmacological VTE agent (>24hrs) due to surgical blood loss or risk of bleeding:  SCD hose were used throughout the surgery and will be continued postoperatively.   Ashtan Girtman P 11:56 AM

## 2013-07-08 NOTE — H&P (Signed)
  History and Physical Interval Note:   07/08/2013   7:15 AM   Paula Crawford  has presented today for surgery, with the diagnosis of Firboids, Anemia, Menorrhagia  The various methods of treatment have been discussed with the patient and family. After consideration of risks, benefits and other options for treatment, the patient has consented to  Procedure(s): HYSTERECTOMY ABDOMINAL as a surgical intervention .  I have examined the patient, reviewed the patients' chart and labs. She had her LMP on8/26/14.  Questions were answered to the patient's satisfaction.     Hal Morales  MD

## 2013-07-08 NOTE — Progress Notes (Signed)
Day of Surgery Procedure(s) (LRB): HYSTERECTOMY ABDOMINAL BILATERAL SALPINGECTOMY  Subjective: Patient reports no nausea and vomiting.  Has sensation in right eye like something's in it.  Also c/o central low back pain  Objective: I have reviewed patient's vital signs, intake and output and medications. BP 154/91  Pulse 68  Temp(Src) 98.7 F (37.1 C) (Oral)  Resp 16  SpO2 100%   Total I/O In: 3500 [I.V.:3500] Out: 1875 [Urine:1500; Blood:375]   General: cooperative, appears stated age and mild distress Resp: clear to auscultation bilaterally.  No CVAT Cardio: regular rate and rhythm, S1, S2 normal, no murmur, click, rub or gallop GI: soft, non-tender; bowel sounds normal; no masses,  no organomegaly and incision: dressing dry Extremities: extremities normal, atraumatic, no cyanosis or edema Vaginal Bleeding: none  Assessment: s/p Procedure(s): HYSTERECTOMY ABDOMINAL (N/A): stable Back pain was a problem prior to surgery Plan: Advance diet Encourage ambulation Advance to PO medication Lidocaine patch for back.. Consult anesthesia re eye care.  LOS: 0 days    Paula Crawford P 07/08/2013, 4:58 PM

## 2013-07-08 NOTE — Anesthesia Postprocedure Evaluation (Signed)
  Anesthesia Post-op Note  Patient: Paula Crawford  Procedure(s) Performed: Procedure(s): HYSTERECTOMY ABDOMINAL (N/A)  Patient Location: PACU  Anesthesia Type:General  Level of Consciousness: awake, alert  and oriented  Airway and Oxygen Therapy: Patient Spontanous Breathing  Post-op Pain: mild  Post-op Assessment: Post-op Vital signs reviewed, Patient's Cardiovascular Status Stable, Respiratory Function Stable, Patent Airway, No signs of Nausea or vomiting and Pain level controlled  Post-op Vital Signs: Reviewed and stable  Complications: No apparent anesthesia complications

## 2013-07-08 NOTE — Anesthesia Procedure Notes (Signed)
Procedure Name: Intubation Date/Time: 07/08/2013 7:38 AM Performed by: Graciela Husbands Pre-anesthesia Checklist: Suction available, Emergency Drugs available, Timeout performed, Patient being monitored and Patient identified Patient Re-evaluated:Patient Re-evaluated prior to inductionOxygen Delivery Method: Circle system utilized Preoxygenation: Pre-oxygenation with 100% oxygen Intubation Type: IV induction Ventilation: Mask ventilation with difficulty and Oral airway inserted - appropriate to patient size Laryngoscope Size: Mac and 3 Grade View: Grade I Laser Tube: Cuffed inflated with minimal occlusive pressure - saline Tube size: 7.0 mm Number of attempts: 1 Airway Equipment and Method: Patient positioned with wedge pillow and Stylet Placement Confirmation: ETT inserted through vocal cords under direct vision,  breath sounds checked- equal and bilateral and positive ETCO2 Secured at: 21 cm Tube secured with: Tape Dental Injury: Teeth and Oropharynx as per pre-operative assessment  Difficulty Due To: Difficulty was unanticipated

## 2013-07-08 NOTE — Anesthesia Preprocedure Evaluation (Addendum)
Anesthesia Evaluation  Patient identified by MRN, date of birth, ID band Patient awake    Reviewed: Allergy & Precautions, H&P , NPO status , Patient's Chart, lab work & pertinent test results, reviewed documented beta blocker date and time   History of Anesthesia Complications Negative for: history of anesthetic complications  Airway Mallampati: I TM Distance: >3 FB Neck ROM: full    Dental  (+) Teeth Intact,    Pulmonary neg pulmonary ROS,  breath sounds clear to auscultation  Pulmonary exam normal       Cardiovascular Exercise Tolerance: Good + Valvular Problems/Murmurs (tricuspid regurg - diagnosed in 8/14) Rhythm:regular Rate:Normal - Systolic murmurs    Neuro/Psych  Headaches (monthly headaches), negative psych ROS   GI/Hepatic negative GI ROS, Neg liver ROS,   Endo/Other  Obese - BMI 38.2  Renal/GU   Female GU complaint     Musculoskeletal   Abdominal   Peds  Hematology  (+) anemia ,   Anesthesia Other Findings   Reproductive/Obstetrics negative OB ROS                          Anesthesia Physical Anesthesia Plan  ASA: II  Anesthesia Plan: General ETT   Post-op Pain Management:    Induction:   Airway Management Planned:   Additional Equipment:   Intra-op Plan:   Post-operative Plan:   Informed Consent: I have reviewed the patients History and Physical, chart, labs and discussed the procedure including the risks, benefits and alternatives for the proposed anesthesia with the patient or authorized representative who has indicated his/her understanding and acceptance.   Dental Advisory Given  Plan Discussed with: CRNA and Surgeon  Anesthesia Plan Comments:         Anesthesia Quick Evaluation

## 2013-07-08 NOTE — H&P (Signed)
Paula Crawford is a 47 YO female P 1-0-0-1 who presents for hysterectomy because of symptomatic uterine fibroids, menorrhagia and anemia. For over five years the patient has had heavy menses but in the past six months she states that her five day flow has become unbearable. She has to change a super tampon along with two pads hourly and experiences severe back "cramping" rated at a 10/10 on a 10 point pain scale. The patient has used heat and Midol for her pain however, relief is only to an 8/10 rating. She denies intermenstrual bleeding, dyspareunia, vaginitis symptoms, bladder problems or changes in her bowel movements. An endometrial biopsy in June 2014 showed superficial fragments of endometrial glands with no evidence of atypia or malignancy. A sonohysterogram at that same time revealed: uterus- 13.8 x 10.2 x 10.0 with a subserosal right anterior fibroid: 7.4 x 6.4 x 7.3 cm and left intramural fundal 3.2 x 3.6 x 3.9 cm, endometrium = 0.866 cm; right ovary-4.3 x 2.7 x 2.6 cm and left ovary-5.5 x 5.5 x 4.37 cm with a simple cyst - 4.4 x 4.2 x 3.9 cm. Normal labs included a comprehensive metabolic panel, TSH and Hgb A1C however, the patient's hemoglobin was 9.4. A review of both medical and surgical management options were given to the patient but after careful consideration, she has chosen definitive therapy in the form of hysterectomy.   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  ferrous sulfate 324 (65 FE) MG TBEC  Take 2 tablets by mouth daily.     .  Melatonin 5 MG TABS  Take 1 tablet by mouth at bedtime.      No Known Allergies   Denies sensitivity to peanuts, shellfish, soy, latex or adhesives.   ROS: Admits to wearing glasses but denies headache, vision changes, nasal congestion, dysphagia, tinnitus, dizziness, hoarseness, cough, chest pain, shortness of breath, nausea, vomiting, diarrhea,constipation, urinary frequency, urgency dysuria, hematuria, vaginitis symptoms, pelvic pain, swelling of joints,easy bruising, myalgias, arthralgias, skin rashes, unexplained weight loss and except as is mentioned in the history of present illness, patient's review of systems is otherwise negative.   Physical Exam   Bp: 126/70 R: 16 Temp.: 98.4 degrees F orally Weight: 209 lbs. Height: 5'1"   Neck: supple without masses or thyromegaly  Lungs: clear to auscultation  Heart: regular rate and rhythm  Abdomen: soft, non-tender with a palpable firm mass from pelvis approximately 3 fingers above symphysis pubis and no organomegaly  Pelvic:EGBUS- wnl; vagina-normal rugae; uterus-12-14 weeks size and irregular (exam limited by habitus) , cervix without lesions or motion tenderness; adnexae-no tenderness or masses  Extremities: no clubbing, cyanosis or edema   Assesment: Symptomatic Uterine Fibroids  Menorrhagia                       Anemia   Disposition: A discussion was held with patient regarding the indication for her procedure(s) along with the risks, which include but are not limited to: reaction to anesthesia, damage to adjacent organs, infection and excessive bleeding. The patient verbalized understanding of  these risks and has consented to proceed with a Total Abdominal Hysterectomy with Bilateral Salpingectomy at [facility] [place] on July 08, 2013 at 7:30 a.m.    CSN# [id number]  Herby Amick J. Lowell Guitar, PA-C for Dr. Maris Berger. Haygood

## 2013-07-08 NOTE — Transfer of Care (Signed)
Immediate Anesthesia Transfer of Care Note  Patient: Paula Crawford  Procedure(s) Performed: Procedure(s): HYSTERECTOMY ABDOMINAL (N/A)  Patient Location: PACU  Anesthesia Type:General  Level of Consciousness: awake, alert  and oriented  Airway & Oxygen Therapy: Patient Spontanous Breathing and Patient connected to nasal cannula oxygen  Post-op Assessment: Report given to PACU RN and Post -op Vital signs reviewed and stable  Post vital signs: Reviewed and stable  Complications: No apparent anesthesia complications

## 2013-07-08 NOTE — Anesthesia Postprocedure Evaluation (Signed)
  Anesthesia Post-op Note  Patient: Paula Crawford  Procedure(s) Performed: Procedure(s): HYSTERECTOMY ABDOMINAL (N/A)  Patient Location: [facility]  Anesthesia Type:General  Level of Consciousness: awake, alert  and oriented  Airway and Oxygen Therapy: Patient Spontanous Breathing  Post-op Pain: mild  Post-op Assessment: Post-op Vital signs reviewed and Patient's Cardiovascular Status Stable  Post-op Vital Signs: Reviewed and stable  Complications: No apparent anesthesia complications

## 2013-07-09 ENCOUNTER — Encounter (HOSPITAL_COMMUNITY): Payer: Self-pay | Admitting: Obstetrics and Gynecology

## 2013-07-09 ENCOUNTER — Other Ambulatory Visit: Payer: Self-pay | Admitting: Obstetrics and Gynecology

## 2013-07-09 LAB — CBC
HCT: 30.9 % — ABNORMAL LOW (ref 36.0–46.0)
Hemoglobin: 9.6 g/dL — ABNORMAL LOW (ref 12.0–15.0)
MCH: 24.4 pg — ABNORMAL LOW (ref 26.0–34.0)
MCHC: 31.1 g/dL (ref 30.0–36.0)

## 2013-07-09 MED ORDER — GLYCERIN (LAXATIVE) 2.1 G RE SUPP
1.0000 | Freq: Four times a day (QID) | RECTAL | Status: DC | PRN
  Administered 2013-07-09: 1 via RECTAL
  Filled 2013-07-09: qty 1

## 2013-07-09 MED ORDER — OXYCODONE-ACETAMINOPHEN 5-325 MG PO TABS: 1.0000 | ORAL_TABLET | ORAL | Status: DC | PRN

## 2013-07-09 MED ORDER — CYCLOBENZAPRINE HCL 5 MG PO TABS
5.0000 mg | ORAL_TABLET | Freq: Three times a day (TID) | ORAL | Status: DC | PRN
  Filled 2013-07-09: qty 1

## 2013-07-09 NOTE — Progress Notes (Signed)
Paula Crawford is a41 y.o.  [id number]  Post Op Date # 1; TAH/BS  Subjective: Patient is complaining of gas pains.  Has ambulated and voided without difficulty.  Admits to vomiting some fruiit she ate earlier but is tolerating liquids.  Has yet to pass flatus but has been belching. Patient has Reports surgical pain is minimal but gas and back pains are the most uncomforable.,   Objective: Vital signs in last 24 hours: Temp:  [97.3 F (36.3 C)-98.6 F (37 C)] 97.3 F (36.3 C) (09/04 1408) Pulse Rate:  [64-94] 76 (09/04 1408) Resp:  [18-20] 18 (09/04 1408) BP: (136-182)/(82-97) 182/97 mmHg (09/04 1408) SpO2:  [97 %-100 %] 97 % (09/04 1408) Weight:  [202 lb (91.627 kg)] 202 lb (91.627 kg) (09/03 1901)  Intake/Output from previous day: 09/03 0701 - 09/04 0700 In: 5054.1 [I.V.:5054.1] Out: 5675 [Urine:5300] Intake/Output this shift: Total I/O In: -  Out: 500 [Urine:300; Emesis/NG output:200]  Recent Labs Lab 07/03/13 0910 07/09/13 0754  WBC 4.3 9.8  HGB 10.8* 9.6*  HCT 34.8* 30.9*  PLT 347 355    No results found for this basename: NA, K, CL, CO2, BUN, CREATININE, CALCIUM, LABALBU, PROT, BILITOT, ALKPHOS, ALT, AST, GLUCOSE,  in the last 168 hours  EXAM: General: Flat affect with monotone speech, appears mildly distressed by gas pains Resp: clear to auscultation bilaterally Cardio: regular rate and rhythm, S1, S2 normal, no murmur, click, rub or gallop GI: Soft and not distended, bandage dressing clean, dry, and intact Extremities: No calf tenderness and negative Homan's   Assessment: s/p Procedure(s): Bilateral Salpingectomy HYSTERECTOMY ABDOMINAL: stable, anemia and Gas Pains  Plan: Glycerin Suppositiories 1 pr now then prn qid Routine Care  LOS: 1 day    Christabella Alvira, PA-C 07/09/2013 5:47 PM

## 2013-07-09 NOTE — Progress Notes (Signed)
1 Day Post-Op Procedure(s) (LRB): HYSTERECTOMY ABDOMINAL (N/A)  Subjective: Patient reports no nausea, vomiting and tolerating PO.  Eye sensation is  Resolved.  Back pain improved for about 2 hrs with lidocaine patch.  It has been d/c'd.  Incisional pain is 4/10.  Back pain is 5/10 now, but resolved with ambulation  Objective: I have reviewed patient's vital signs, intake and output, medications and labs.  General: alert, cooperative and mild distress Resp: clear to auscultation bilaterally Cardio: S1, S2 normal, no S3 or S4 and systolic murmur: systolic ejection 2/6 at lsb Extremities: extremities normal, atraumatic, no cyanosis or edema Vaginal Bleeding: none  Assessment: s/p Procedure(s): HYSTERECTOMY ABDOMINAL (N/A): stable BP improved.  Pt has hx of intermittent increases but has never required medication. Known SEmurmur, probably flow murmur from chronic anemia Chronic back pain which improves with ambulation.  No CVAT Plan: Advance diet Encourage ambulation Advance to PO medication Discontinue IV fluids Discharge home ON 07/10/13 if stable  LOS: 1 day    Damione Robideau P 07/09/2013, 8:13 AM

## 2013-07-10 MED ORDER — OXYCODONE-ACETAMINOPHEN 5-325 MG PO TABS: ORAL_TABLET | ORAL | Status: DC

## 2013-07-10 MED ORDER — CYCLOBENZAPRINE HCL 5 MG PO TABS: 5.0000 mg | ORAL_TABLET | Freq: Three times a day (TID) | ORAL | Status: DC | PRN

## 2013-07-10 MED ORDER — DOCUSATE SODIUM 100 MG PO CAPS: 100.0000 mg | ORAL_CAPSULE | Freq: Two times a day (BID) | ORAL | Status: DC

## 2013-07-10 MED ORDER — IBUPROFEN 600 MG PO TABS: ORAL_TABLET | ORAL | Status: DC

## 2013-07-10 MED ORDER — ONDANSETRON HCL 4 MG PO TABS: 4.0000 mg | ORAL_TABLET | Freq: Three times a day (TID) | ORAL | Status: DC | PRN

## 2013-07-10 NOTE — Progress Notes (Signed)
Patient's abdomen soft and distended, no increase in size.  Bowel sounds hypoactive, greater in right quadrants then left.  Patient tolerating ice chips.  Nausea relieved by Zofran.  Ambulating patient in halls q2h, tolerating ambulation well.

## 2013-07-10 NOTE — Progress Notes (Signed)
Patient vomited 800cc bile.  Bowel sounds positive on right side.  Patient stated passed flatus earlier during night.  Abdomen slightly distended but soft.  Patient stated felt better after vomiting.  Zofran 4mg  po given.  Encouraged patient to stay on ice chips for now and ambulated in halls every time up to bathroom.

## 2013-07-10 NOTE — Progress Notes (Signed)
Paula Crawford is a66 y.o.  [id number]  Post Op Date # 2; TAH/BS  Subjective: Patient is much improved from yesterday, states that last night was the best sleep she's had since admission. Patient has denied  any significant pain, is ambulating every 2 hours in the hall and though she vomited earlier this morning, she is no longer nauseous., Has passed flatus, currently only eating ice chips, voiding without difficulty and reports walking in the halls every 2 hours without difficulty.  Objective: Vital signs in last 24 hours: Temp:  [97.3 F (36.3 C)-99.4 F (37.4 C)] 98.2 F (36.8 C) (09/05 0555) Pulse Rate:  [68-98] 87 (09/05 0555) Resp:  [18-20] 18 (09/05 0555) BP: (144-182)/(70-98) 147/77 mmHg (09/05 0555) SpO2:  [97 %-100 %] 98 % (09/05 0555)  Intake/Output from previous day: 09/04 0701 - 09/05 0700 In: -  Out: 1300 [Urine:300] Intake/Output this shift:    Recent Labs Lab 07/03/13 0910 07/09/13 0754  WBC 4.3 9.8  HGB 10.8* 9.6*  HCT 34.8* 30.9*  PLT 347 355    No results found for this basename: NA, K, CL, CO2, BUN, CREATININE, CALCIUM, LABALBU, PROT, BILITOT, ALKPHOS, ALT, AST, GLUCOSE,  in the last 168 hours  EXAM: General: alert, no distress and smiling. Resp: clear to auscultation bilaterally Cardio: regular rate and rhythm, S1, S2 normal, no murmur, click, rub or gallop GI: Soft, active bowel sounds in all four quadrants. Extremities: no calf tenderness and negative Homan's sign Incisional dressing, clean, dry and intact  Assessment: s/p Procedure(s): HYSTERECTOMY ABDOMINAL/Bilateral Salpingectomy: stable, progressing well and anemia  Plan: Routine care  LOS: 2 days    Paula,[name], PA-C 07/10/2013 7:38 AM  Pt reevaluated at about 8 pm after which she had tolerated a regular diet, had not required any pain med all day, was ambulating well and having regular flatus.  She was d/c'd home with her husband.

## 2013-07-10 NOTE — Progress Notes (Signed)
Pt discharged home with husband... Condition stable... No equipment... Ambulated to car with E. Chetan Mehring, RN. 

## 2013-07-10 NOTE — Discharge Summary (Signed)
  Physician Discharge Summary  Patient ID: Paula Crawford MRN: [id number] DOB/AGE: 13-Nov-1965 47 y.o.  Admit date: 07/08/2013 Discharge date: 07/10/2013   Discharge Diagnoses: Symptomatic Uterine Fibroids, Menorrhagia and Anemia Active Problems:   Fibroids   Menorrhagia   Anemia   Operation: Total Abdominal Hysterectomy with Bilateral Salpingectomy   Discharged Condition: Good  Hospital Course: On the date of admission the patient underwent the aforementioned procedures, tolerating them well.  Post operative course was marked by slow return of bowel function however, by post operative day #2 both bowel and bladder function had returned.  Following surgery the patient's hemoglobin was 9.6 and well  tolerated (pre-op hemoglobin = 9.4) and since she had achieved the maximum benefit of her hospital stay, the patient was deemed ready for discharge home  on post operative day #2.  Disposition: Home to self care  Discharge Medications:    Medication List         aspirin-acetaminophen-caffeine 250-250-65 MG per tablet  Commonly known as:  EXCEDRIN MIGRAINE  Take 2 tablets by mouth daily as needed for pain.     cyclobenzaprine 5 MG tablet  Commonly known as:  FLEXERIL  Take 1 tablet (5 mg total) by mouth 3 (three) times daily as needed for muscle spasms (may increase to 10 mg tid if no relief from 5 mg).     ferrous sulfate 324 (65 FE) MG Tbec  Take 2 tablets by mouth daily.     ibuprofen 600 MG tablet  Commonly known as:  ADVIL,MOTRIN  1 po pc every 6 hours for 5 days then prn for pain     Melatonin 5 MG Tabs  Take 1 tablet by mouth at bedtime.     ondansetron 4 MG tablet  Commonly known as:  ZOFRAN  Take 1 tablet (4 mg total) by mouth every 8 (eight) hours as needed for nausea.     oxyCODONE-acetaminophen 5-325 MG per tablet  Commonly known as:  PERCOCET/ROXICET  1-2 tablets every 6 hours as needed for moderate to severe pain          Follow-up: Dr. Erie Noe P.  Lissy Deuser,  July 21, 2013 at 8:45 a.m.   SignedHenreitta Leber, PA-C 07/10/2013, 7:56 AM

## 2014-09-06 ENCOUNTER — Encounter (HOSPITAL_COMMUNITY): Payer: Self-pay | Admitting: Obstetrics and Gynecology

## 2014-10-08 ENCOUNTER — Other Ambulatory Visit: Payer: Self-pay

## 2014-10-08 DIAGNOSIS — Z1231 Encounter for screening mammogram for malignant neoplasm of breast: Secondary | ICD-10-CM

## 2014-10-22 ENCOUNTER — Ambulatory Visit
Admission: RE | Admit: 2014-10-22 | Discharge: 2014-10-22 | Disposition: A | Discharge status: Home / Self Care | Payer: BC Managed Care – PPO | Source: Ambulatory Visit

## 2014-10-22 DIAGNOSIS — Z1231 Encounter for screening mammogram for malignant neoplasm of breast: Secondary | ICD-10-CM

## 2016-11-05 DIAGNOSIS — A4902 Methicillin resistant Staphylococcus aureus infection, unspecified site: Secondary | ICD-10-CM

## 2016-11-05 HISTORY — DX: Methicillin resistant Staphylococcus aureus infection, unspecified site: A49.02

## 2017-04-19 ENCOUNTER — Ambulatory Visit
Admission: RE | Admit: 2017-04-19 | Discharge: 2017-04-19 | Disposition: A | Discharge status: Home / Self Care | Payer: BLUE CROSS/BLUE SHIELD | Source: Ambulatory Visit | Attending: Family Medicine | Admitting: Family Medicine

## 2017-04-19 ENCOUNTER — Other Ambulatory Visit: Payer: Self-pay | Admitting: Family Medicine

## 2017-04-19 DIAGNOSIS — M25572 Pain in left ankle and joints of left foot: Secondary | ICD-10-CM

## 2018-11-18 ENCOUNTER — Other Ambulatory Visit: Payer: Self-pay | Admitting: Family Medicine

## 2018-11-19 ENCOUNTER — Other Ambulatory Visit: Payer: Self-pay | Admitting: Family Medicine

## 2018-11-19 DIAGNOSIS — N6489 Other specified disorders of breast: Secondary | ICD-10-CM

## 2019-05-15 ENCOUNTER — Other Ambulatory Visit: Payer: BLUE CROSS/BLUE SHIELD

## 2019-05-22 ENCOUNTER — Ambulatory Visit
Admission: RE | Admit: 2019-05-22 | Discharge: 2019-05-22 | Disposition: A | Discharge status: Home / Self Care | Payer: BC Managed Care – PPO | Source: Ambulatory Visit | Attending: Family Medicine | Admitting: Family Medicine

## 2019-05-22 ENCOUNTER — Other Ambulatory Visit: Payer: Self-pay | Admitting: Family Medicine

## 2019-05-22 DIAGNOSIS — N6310 Unspecified lump in the right breast, unspecified quadrant: Secondary | ICD-10-CM

## 2019-05-22 DIAGNOSIS — N6489 Other specified disorders of breast: Secondary | ICD-10-CM

## 2019-05-25 ENCOUNTER — Ambulatory Visit
Admission: RE | Admit: 2019-05-25 | Discharge: 2019-05-25 | Disposition: A | Discharge status: Home / Self Care | Payer: BC Managed Care – PPO | Source: Ambulatory Visit | Attending: Family Medicine | Admitting: Family Medicine

## 2019-05-25 ENCOUNTER — Other Ambulatory Visit: Payer: Self-pay

## 2019-05-25 ENCOUNTER — Other Ambulatory Visit: Payer: Self-pay | Admitting: Diagnostic Radiology

## 2019-05-25 ENCOUNTER — Other Ambulatory Visit: Payer: Self-pay | Admitting: Family Medicine

## 2019-05-25 DIAGNOSIS — N6310 Unspecified lump in the right breast, unspecified quadrant: Secondary | ICD-10-CM

## 2019-05-29 ENCOUNTER — Telehealth: Payer: Self-pay

## 2019-05-29 NOTE — Telephone Encounter (Signed)
Covid-19 screening questions   Do you now or have you had a fever in the last 14 days? no  Do you have any respiratory symptoms of shortness of breath or cough now or in the last 14 days? No   Do you have any family members or close contacts with diagnosed or suspected Covid-19 in the past 14 days? No  Have you been tested for Covid-19 and found to be positive? No        

## 2019-06-01 ENCOUNTER — Ambulatory Visit: Payer: BC Managed Care – PPO | Admitting: Gastroenterology

## 2019-06-01 ENCOUNTER — Encounter: Payer: Self-pay | Admitting: Gastroenterology

## 2019-06-01 VITALS — BP 142/74 | HR 110 | Temp 97.9°F | Ht 61.0 in | Wt 253.0 lb

## 2019-06-01 DIAGNOSIS — K625 Hemorrhage of anus and rectum: Secondary | ICD-10-CM

## 2019-06-01 DIAGNOSIS — Z1211 Encounter for screening for malignant neoplasm of colon: Secondary | ICD-10-CM | POA: Diagnosis not present

## 2019-06-01 MED ORDER — SUPREP BOWEL PREP KIT 17.5-3.13-1.6 GM/177ML PO SOLN: 1.0000 | ORAL | 0 refills | Status: DC

## 2019-06-01 NOTE — Patient Instructions (Signed)
You have been scheduled for a colonoscopy. Please follow written instructions given to you at your visit today.  Please pick up your prep supplies at the pharmacy within the next 1-3 days. If you use inhalers (even only as needed), please bring them with you on the day of your procedure. Your physician has requested that you go to www.startemmi.com and enter the access code given to you at your visit today. This web site gives a general overview about your procedure. However, you should still follow specific instructions given to you by our office regarding your preparation for the procedure.  If you are age 49 or older, your body mass index should be between 23-30. Your Body mass index is 47.8 kg/m. If this is out of the aforementioned range listed, please consider follow up with your Primary Care Provider.  If you are age 42 or younger, your body mass index should be between 19-25. Your Body mass index is 47.8 kg/m. If this is out of the aformentioned range listed, please consider follow up with your Primary Care Provider.

## 2019-06-01 NOTE — Progress Notes (Signed)
[facility] Consult Note:  History: Paula Crawford 06/01/2019  Referring provider: Leonides Sake, MD  Reason for consult/chief complaint: Rectal Bleeding (x 2 months)   Subjective  HPI:  This is a very pleasant 53 year old woman referred by her primary care provider for recent onset rectal bleeding.  She has had occasional painless rectal bleeding over the last couple of months.  No change in bowel habits.  She will occasionally go a day without a BM, but after that will go daily for several days without straining.  Denies dyschezia, abdominal pain, dysphagia, odynophagia, nausea, vomiting, early satiety or weight loss.  No family history colorectal cancer.   ROS:  Review of Systems  Constitutional: Negative for appetite change and unexpected weight change.  HENT: Negative for mouth sores and voice change.   Eyes: Negative for pain and redness.  Respiratory: Negative for cough and shortness of breath.   Cardiovascular: Negative for chest pain and palpitations.  Genitourinary: Negative for dysuria and hematuria.  Musculoskeletal: Negative for arthralgias and myalgias.  Skin: Negative for pallor and rash.  Neurological: Negative for weakness and headaches.  Hematological: Negative for adenopathy.     Past Medical History: Past Medical History:  Diagnosis Date  . Anemia   . Anxiety   . 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 Hypertension   . Insomnia   . Obese   . Prediabetes      Past Surgical History: Past Surgical History:  Procedure Laterality Date  . ABDOMINAL HYSTERECTOMY N/A 07/08/2013   Procedure: HYSTERECTOMY ABDOMINAL;  Surgeon: Eldred Manges, MD;  Location: [facility];  Service: Gynecology;  Laterality: N/A;  . CESAREAN SECTION    . TOTAL ABDOMINAL HYSTERECTOMY  2014     Family History: Family History  Problem Relation Age of Onset  . Heart disease Mother   . Heart murmur Mother   . Healthy Son      Social History: Social History   Socioeconomic History  . Marital status: Married    Spouse name: Not on file  . Number of children: Not on file  . Years of education: Not on file  . Highest education level: Not on file  Occupational History  . Not on file  Social Needs  . Financial resource strain: Not on file  . Food insecurity    Worry: Not on file    Inability: Not on file  . Transportation needs    Medical: Not on file    Non-medical: Not on file  Tobacco Use  . Smoking status: Never Smoker  . Smokeless tobacco: Never Used  Substance and Sexual Activity  . Alcohol use: Yes    Alcohol/week: 2.0 standard drinks    Types: 2 Glasses of wine per week    Comment: occ   . Drug use: No  . Sexual activity: Not on file  Lifestyle  . Physical activity    Days per week: Not on file    Minutes per session: Not on file  . Stress: Not on file  Relationships  . Social Herbalist on phone: Not on file    Gets together: Not on file    Attends religious service: Not on file    Active member of club or organization: Not on file    Attends meetings of clubs or organizations: Not on file    Relationship status: Not on file  Other Topics Concern  . Not on file  Social History Narrative  . Not on file    Allergies: No Known Allergies  Outpatient Meds: Current Outpatient Medications  Medication Sig Dispense Refill  . amitriptyline (ELAVIL) 25 MG tablet Take 25 mg by mouth at bedtime.    Marland Kitchen aspirin EC 81 MG tablet Take 81 mg by mouth daily.    Marland Kitchen aspirin-acetaminophen-caffeine (EXCEDRIN MIGRAINE) 250-250-65 MG per tablet Take 2 tablets by mouth daily as needed for pain.    . hydrochlorothiazide (HYDRODIURIL) 25 MG tablet Take 25 mg by mouth daily.    Marland Kitchen losartan (COZAAR) 100 MG tablet Take 100 mg by mouth daily.    . Melatonin 5 MG TABS Take 1 tablet by mouth at bedtime.    . cyclobenzaprine (FLEXERIL) 5 MG tablet Take 1 tablet (5 mg total) by mouth 3 (three) times  daily as needed for muscle spasms (may increase to 10 mg tid if no relief from 5 mg). 30 tablet 0  . docusate sodium (COLACE) 100 MG capsule Take 1 capsule (100 mg total) by mouth 2 (two) times daily. 60 capsule 2  . ferrous sulfate 324 (65 FE) MG TBEC Take 2 tablets by mouth daily.    Marland Kitchen ibuprofen (ADVIL,MOTRIN) 600 MG tablet 1 po pc every 6 hours for 5 days then prn for pain 30 tablet 1  . ondansetron (ZOFRAN) 4 MG tablet Take 1 tablet (4 mg total) by mouth every 8 (eight) hours as needed for nausea. 20 tablet 0  . oxyCODONE-acetaminophen (PERCOCET/ROXICET) 5-325 MG per tablet 1-2 tablets every 6 hours as needed for moderate to severe pain 30 tablet 0   No current facility-administered medications for this visit.       ___________________________________________________________________ Objective   Exam:  BP (!) 142/74 (BP Location: Other (Comment), Patient Position: Sitting, Cuff Size: Normal) Comment (BP Location): forearm  Pulse (!) 110   Temp 97.9 F (36.6 C)   Ht 5\' 1"  (1.549 m)   Wt 253 lb (114.8 kg)   SpO2 98%   BMI 47.80 kg/m    General: Well-appearing  Eyes: sclera anicteric, no redness  ENT: oral mucosa moist without lesions, no cervical or supraclavicular lymphadenopathy  CV: RRR without murmur, S1/S2, no JVD, no peripheral edema  Resp: clear to auscultation bilaterally, normal RR and effort noted  GI: soft, obese, no tenderness, with active bowel sounds. No guarding or palpable organomegaly noted -limited by body habitus  Skin; warm and dry, no rash or jaundice noted  Neuro: awake, alert and oriented x 3. Normal gross motor function and fluent speech Rectal: Normal external, normal resting and voluntary sphincter tone, no palpable internal lesions Labs:  Normal recent CMP, TSH and hemoglobin A1c   Assessment: No diagnosis found. Rectal bleeding Average risk for colorectal cancer  Most likely benign anal rectal bleeding, probably hemorrhoids.  Plan:   Screening colonoscopy.  She is agreeable after discussion of procedure and risks.  The benefits and risks of the planned procedure were described in detail with the patient or (when appropriate) their health care proxy.  Risks were outlined as including, but not limited to, bleeding, infection, perforation, adverse medication reaction leading to cardiac or pulmonary decompensation.  The limitation of incomplete mucosal visualization was also discussed.  No guarantees or warranties were given.  Patient at increased risk for cardiopulmonary complications of procedure due to medical comorbidities.(morbid obesity)  Thank you for the courtesy of this consult.  Please call me with any questions or concerns.  Nelida Meuse III  CC: Referring  provider noted above

## 2019-06-02 ENCOUNTER — Encounter: Payer: Self-pay | Admitting: Gastroenterology

## 2019-06-04 ENCOUNTER — Other Ambulatory Visit: Payer: Self-pay | Admitting: General Surgery

## 2019-06-04 ENCOUNTER — Telehealth: Payer: Self-pay | Admitting: Gastroenterology

## 2019-06-04 DIAGNOSIS — R928 Other abnormal and inconclusive findings on diagnostic imaging of breast: Secondary | ICD-10-CM

## 2019-06-04 NOTE — Telephone Encounter (Signed)

## 2019-06-05 ENCOUNTER — Encounter: Payer: Self-pay | Admitting: Gastroenterology

## 2019-06-05 ENCOUNTER — Ambulatory Visit (AMBULATORY_SURGERY_CENTER): Payer: BC Managed Care – PPO | Admitting: Gastroenterology

## 2019-06-05 ENCOUNTER — Other Ambulatory Visit: Payer: Self-pay

## 2019-06-05 VITALS — BP 114/71 | HR 89 | Temp 96.2°F | Resp 27 | Ht 61.0 in | Wt 253.0 lb

## 2019-06-05 DIAGNOSIS — Z1211 Encounter for screening for malignant neoplasm of colon: Secondary | ICD-10-CM

## 2019-06-05 DIAGNOSIS — D128 Benign neoplasm of rectum: Secondary | ICD-10-CM

## 2019-06-05 DIAGNOSIS — K621 Rectal polyp: Secondary | ICD-10-CM

## 2019-06-05 MED ORDER — SODIUM CHLORIDE 0.9 % IV SOLN: 500.0000 mL | Freq: Once | INTRAVENOUS | Status: DC

## 2019-06-05 NOTE — Progress Notes (Signed)
Paula Crawford temps/ vitals covid [name]'s states no medical or surgical changes since previsit or office visit.

## 2019-06-05 NOTE — Patient Instructions (Signed)
Discharge instructions given. Handouts on polyps and Diverticulosis. Repeat Colonoscopy at appointment to be scheduled for EMR of polyp. Resume previous medications. YOU HAD AN ENDOSCOPIC PROCEDURE TODAY AT [facility]:   Refer to the procedure report that was given to you for any specific questions about what was found during the examination.  If the procedure report does not answer your questions, please call your gastroenterologist to clarify.  If you requested that your care partner not be given the details of your procedure findings, then the procedure report has been included in a sealed envelope for you to review at your convenience later.  YOU SHOULD EXPECT: Some feelings of bloating in the abdomen. Passage of more gas than usual.  Walking can help get rid of the air that was put into your GI tract during the procedure and reduce the bloating. If you had a lower endoscopy (such as a colonoscopy or flexible sigmoidoscopy) you may notice spotting of blood in your stool or on the toilet paper. If you underwent a bowel prep for your procedure, you may not have a normal bowel movement for a few days.  Please Note:  You might notice some irritation and congestion in your nose or some drainage.  This is from the oxygen used during your procedure.  There is no need for concern and it should clear up in a day or so.  SYMPTOMS TO REPORT IMMEDIATELY:   Following lower endoscopy (colonoscopy or flexible sigmoidoscopy):  Excessive amounts of blood in the stool  Significant tenderness or worsening of abdominal pains  Swelling of the abdomen that is new, acute  Fever of 100F or higher   For urgent or emergent issues, a gastroenterologist can be reached at any hour by calling [phone].   DIET:  We do recommend a small meal at first, but then you may proceed to your regular diet.  Drink plenty of fluids but you should avoid alcoholic beverages for 24 hours.  ACTIVITY:  You  should plan to take it easy for the rest of today and you should NOT DRIVE or use heavy machinery until tomorrow (because of the sedation medicines used during the test).    FOLLOW UP: Our staff will call the number listed on your records 48-72 hours following your procedure to check on you and address any questions or concerns that you may have regarding the information given to you following your procedure. If we do not reach you, we will leave a message.  We will attempt to reach you two times.  During this call, we will ask if you have developed any symptoms of COVID 19. If you develop any symptoms (ie: fever, flu-like symptoms, shortness of breath, cough etc.) before then, please call [phone].  If you test positive for Covid 19 in the 2 weeks post procedure, please call and report this information to [place].    If any biopsies were taken you will be contacted by phone or by letter within the next 1-3 weeks.  Please call us at [phone] if you have not heard about the biopsies in 3 weeks.    SIGNATURES/CONFIDENTIALITY: You and/or your care partner have signed paperwork which will be entered into your electronic medical record.  These signatures attest to the fact that that the information above on your After Visit Summary has been reviewed and is understood.  Full responsibility of the confidentiality of this discharge information lies with you and/or your care-partner.

## 2019-06-05 NOTE — Progress Notes (Signed)
Called to room to assist during endoscopic procedure.  Patient ID and intended procedure confirmed with present staff. Received instructions for my participation in the procedure from the performing physician.  

## 2019-06-05 NOTE — Progress Notes (Signed)
Report given to PACU, vss 

## 2019-06-05 NOTE — Op Note (Signed)
[place] Patient Name: Paula Crawford Procedure Date: 06/05/2019 9:29 AM MRN: [id number] Endoscopist: [place]. Loletha Carrow , MD Age: 53 Referring MD:  Date of Birth: August 06, 1966 Gender: Female Account #: [id number] Procedure:                Colonoscopy Indications:              Screening for colorectal malignant neoplasm, This                            is the patient's first colonoscopy Medicines:                Monitored Anesthesia Care Procedure:                Pre-Anesthesia Assessment:                           - Prior to the procedure, a History and Physical                            was performed, and patient medications and                            allergies were reviewed. The patient's tolerance of                            previous anesthesia was also reviewed. The risks                            and benefits of the procedure and the sedation                            options and risks were discussed with the patient.                            All questions were answered, and informed consent                            was obtained. Prior Anticoagulants: The patient has                            taken no previous anticoagulant or antiplatelet                            agents. ASA Grade Assessment: III - A patient with                            severe systemic disease. After reviewing the risks                            and benefits, the patient was deemed in                            satisfactory condition to undergo the procedure.  After obtaining informed consent, the colonoscope                            was passed under direct vision. Throughout the                            procedure, the patient's blood pressure, pulse, and                            oxygen saturations were monitored continuously. The                            Colonoscope was introduced through the anus and                            advanced to the the  cecum, identified by                            appendiceal orifice and ileocecal valve. The                            colonoscopy was performed without difficulty. The                            patient tolerated the procedure well. The quality                            of the bowel preparation was good. The ileocecal                            valve, appendiceal orifice, and rectum were                            photographed. Scope In: 9:39:40 AM Scope Out: 9:58:18 AM Scope Withdrawal Time: 0 hours 15 minutes 53 seconds  Total Procedure Duration: 0 hours 18 minutes 38 seconds  Findings:                 The perianal and digital rectal examinations were                            normal.                           A 30-35 mm polyp was found in the distal rectum                            (near, but not involving, the dentate line), on the                            right lateral aspect. The polyp was carpet-like,                            multi-lobulated and sessile. Biopsies were taken  with a cold forceps for histology.                           A few small-mouthed diverticula were found in the                            sigmoid colon.                           The exam was otherwise without abnormality on                            direct and retroflexion views. Complications:            No immediate complications. Estimated Blood Loss:     Estimated blood loss was minimal. Impression:               - One 30-35 mm polyp in the rectum. Biopsied.                           - Diverticulosis in the sigmoid colon.                           - The examination was otherwise normal on direct                            and retroflexion views. Recommendation:           - Patient has a contact number available for                            emergencies. The signs and symptoms of potential                            delayed complications were discussed with the                             patient. Return to normal activities tomorrow.                            Written discharge instructions were provided to the                            patient.                           - Resume previous diet.                           - Continue present medications.                           - Await pathology results.                           - Repeat colonoscopy at appointment to be scheduled  for EMR of polyp. Henry L. Loletha Carrow, MD 06/05/2019 10:07:10 AM This report has been signed electronically.

## 2019-06-10 ENCOUNTER — Telehealth: Payer: Self-pay | Admitting: *Deleted

## 2019-06-10 NOTE — Telephone Encounter (Signed)
No answer for post procedure call back. Left message for patient to call with questions or concerns. SM 

## 2019-06-11 ENCOUNTER — Telehealth: Payer: Self-pay

## 2019-06-11 NOTE — Telephone Encounter (Signed)
06/19/19 at 850 am appt with Dr Rush Landmark to discuss EMR.  The pt was advised via message and advised to call if unavailable for that appt.

## 2019-06-11 NOTE — Telephone Encounter (Signed)
-----   Message from Irving Copas., MD sent at 06/11/2019  9:30 AM EDT ----- Regarding: Follow up and EMR discussion Mallie Mussel, I think it is worth [place] moving forward with attempt at EMR. Breaker Springer, please set up a clinic discussion and EMR for the coming weeks. Thanks. GM ----- Message ----- From: Doran Stabler, MD Sent: 06/09/2019   4:34 PM EDT To: Irving Copas., MD  Gabe,    Found a large, carpet-like rectal TA with HGD on this lady's recent first screening colonoscopy.  I think that in your hands, it may be amenable to EMR.  Please take a look and see what you think.  If not, I will send her for trans-anal excision.  Thanks a bunch.  Mallie Mussel

## 2019-06-18 ENCOUNTER — Other Ambulatory Visit: Payer: Self-pay | Admitting: General Surgery

## 2019-06-18 DIAGNOSIS — R928 Other abnormal and inconclusive findings on diagnostic imaging of breast: Secondary | ICD-10-CM

## 2019-06-19 ENCOUNTER — Other Ambulatory Visit (INDEPENDENT_AMBULATORY_CARE_PROVIDER_SITE_OTHER): Payer: BC Managed Care – PPO

## 2019-06-19 ENCOUNTER — Encounter: Payer: Self-pay | Admitting: Gastroenterology

## 2019-06-19 ENCOUNTER — Ambulatory Visit: Payer: BC Managed Care – PPO | Admitting: Gastroenterology

## 2019-06-19 VITALS — BP 120/74 | HR 96 | Temp 98.2°F | Ht 61.0 in | Wt 246.4 lb

## 2019-06-19 DIAGNOSIS — D128 Benign neoplasm of rectum: Secondary | ICD-10-CM | POA: Diagnosis not present

## 2019-06-19 DIAGNOSIS — K621 Rectal polyp: Secondary | ICD-10-CM | POA: Diagnosis not present

## 2019-06-19 DIAGNOSIS — R933 Abnormal findings on diagnostic imaging of other parts of digestive tract: Secondary | ICD-10-CM

## 2019-06-19 LAB — CBC
HCT: 41.8 % (ref 36.0–46.0)
Hemoglobin: 13.6 g/dL (ref 12.0–15.0)
MCHC: 32.6 g/dL (ref 30.0–36.0)
MCV: 84.6 fl (ref 78.0–100.0)
Platelets: 351 10*3/uL (ref 150.0–400.0)
RBC: 4.94 Mil/uL (ref 3.87–5.11)
RDW: 16.1 % — ABNORMAL HIGH (ref 11.5–15.5)
WBC: 6.2 10*3/uL (ref 4.0–10.5)

## 2019-06-19 LAB — BASIC METABOLIC PANEL
BUN: 14 mg/dL (ref 6–23)
CO2: 30 mEq/L (ref 19–32)
Calcium: 10.7 mg/dL — ABNORMAL HIGH (ref 8.4–10.5)
Chloride: 101 mEq/L (ref 96–112)
Creatinine, Ser: 0.85 mg/dL (ref 0.40–1.20)
GFR: 84.71 mL/min (ref >60.00)
Glucose, Bld: 113 mg/dL — ABNORMAL HIGH (ref 70–99)
Potassium: 3.7 mEq/L (ref 3.5–5.1)
Sodium: 140 mEq/L (ref 135–145)

## 2019-06-19 LAB — PROTIME-INR
INR: 1 ratio (ref 0.8–1.0)
Prothrombin Time: 12 s (ref 9.6–13.1)

## 2019-06-19 MED ORDER — PEG 3350-KCL-NA BICARB-NACL 420 G PO SOLR: 4000.0000 mL | Freq: Once | ORAL | 0 refills | Status: AC

## 2019-06-19 NOTE — Patient Instructions (Signed)
If you are age 52 or older, your body mass index should be between 23-30. Your Body mass index is 46.55 kg/m. If this is out of the aforementioned range listed, please consider follow up with your Primary Care Provider.  If you are age 31 or younger, your body mass index should be between 19-25. Your Body mass index is 46.55 kg/m. If this is out of the aformentioned range listed, please consider follow up with your Primary Care Provider.   Your provider has requested that you go to the basement level for lab work before leaving today. Press "B" on the elevator. The lab is located at the first door on the left as you exit the elevator.  You have been scheduled for a colonoscopy. Please follow written instructions given to you at your visit today.  Please pick up your prep supplies at the pharmacy within the next 1-3 days. If you use inhalers (even only as needed), please bring them with you on the day of your procedure.  Thank you for choosing me and [facility].  Dr. Rush Landmark

## 2019-06-21 ENCOUNTER — Encounter: Payer: Self-pay | Admitting: Gastroenterology

## 2019-06-21 DIAGNOSIS — D128 Benign neoplasm of rectum: Secondary | ICD-10-CM | POA: Insufficient documentation

## 2019-06-21 DIAGNOSIS — R933 Abnormal findings on diagnostic imaging of other parts of digestive tract: Secondary | ICD-10-CM | POA: Insufficient documentation

## 2019-06-21 NOTE — Progress Notes (Signed)
[facility] VISIT   Primary Care Provider [place], [address], [place] [address] [phone]  Referring Provider Dr. Loletha Carrow  Patient Profile: Paula Crawford is a 53 y.o. female with a pmh significant for hypertension, obesity, prediabetes, anxiety, colorectal polyp (TA with HGD), hemorrhoids.  The patient presents to the [facility] for an evaluation and management of problem(s) noted below:  Problem List 1. Adenomatous rectal polyp   2. Abnormal colonoscopy     History of Present Illness Please see initial consultation note by Dr. Loletha Carrow for full details of HPI.  Interval History This is a patient who initially presented with rectal bleeding to our office and underwent a colonoscopy for screening and diagnostic purposes.  A carpeted polyp was found in the rectum near the dentate line but not involving it which returned on biopsy as TA with high-grade dysplasia.  The patient is referred for consideration of advanced resection consideration via colonoscopy.  Patient does not have any significant symptoms currently and is not having rectal bleeding at this time.  Her family history has been updated but there is no history of colon cancer or colon polyps in her family.  She denies any abdominal pain or discomfort.  She wonders what a surgery could involve versus our ability to remove this via colonoscopy.  GI Review of Systems Positive as above Negative for pyrosis, dysphagia, weight loss, change in bowel habits, abdominal pain, nausea, vomiting  Review of Systems General: Denies fevers/chills/weight loss Cardiovascular: Denies chest pain/palpitations Pulmonary: Denies shortness of breath Gastroenterological: See HPI Genitourinary: Denies darkened urine Hematological: Denies easy bruising/bleeding Dermatological: Denies jaundice Psychological: Mood is stable   Medications Current Outpatient Medications    Medication Sig Dispense Refill  . amitriptyline (ELAVIL) 25 MG tablet Take 25 mg by mouth at bedtime.    . hydrochlorothiazide (HYDRODIURIL) 25 MG tablet Take 25 mg by mouth daily.    Marland Kitchen losartan (COZAAR) 100 MG tablet Take 100 mg by mouth daily.     No current facility-administered medications for this visit.     Allergies Allergies  Allergen Reactions  . Morphine And Related Itching    Histories Past Medical History:  Diagnosis Date  . Anemia   . Anxiety   . 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 Hypertension   . Insomnia   . Obese   . Prediabetes    Past Surgical History:  Procedure Laterality Date  . ABDOMINAL HYSTERECTOMY N/A 07/08/2013   Procedure: HYSTERECTOMY ABDOMINAL;  Surgeon: Eldred Manges, MD;  Location: [facility];  Service: Gynecology;  Laterality: N/A;  . CESAREAN SECTION    . TOTAL ABDOMINAL HYSTERECTOMY  2014   Social History   Socioeconomic History  . Marital status: Married    Spouse name: Not on file  . Number of children: Not on file  . Years of education: Not on file  . Highest education level: Not on file  Occupational History  . Not on file  Social Needs  . Financial resource strain: Not on file  . Food insecurity    Worry: Not on file    Inability: Not on file  . Transportation needs    Medical: Not on file    Non-medical: Not on file  Tobacco Use  . Smoking status: Never Smoker  . Smokeless tobacco: Never Used  Substance and Sexual Activity  . Alcohol use: Yes    Alcohol/week: 2.0 standard drinks  Types: 2 Glasses of wine per week    Comment: occ   . Drug use: No  . Sexual activity: Not on file  Lifestyle  . Physical activity    Days per week: Not on file    Minutes per session: Not on file  . Stress: Not on file  Relationships  . Social Herbalist on phone: Not on file    Gets together: Not on file    Attends religious service: Not on file    Active member of club or organization: Not  on file    Attends meetings of clubs or organizations: Not on file    Relationship status: Not on file  . Intimate partner violence    Fear of current or ex partner: Not on file    Emotionally abused: Not on file    Physically abused: Not on file    Forced sexual activity: Not on file  Other Topics Concern  . Not on file  Social History Narrative  . Not on file   Family History  Problem Relation Age of Onset  . Heart disease Mother   . Heart murmur Mother   . Healthy Son   . Colon cancer Neg Hx   . Esophageal cancer Neg Hx   . Inflammatory bowel disease Neg Hx   . Liver disease Neg Hx   . Pancreatic cancer Neg Hx   . Rectal cancer Neg Hx   . Stomach cancer Neg Hx    I have reviewed her medical, social, and family history in detail and updated the electronic medical record as necessary.    PHYSICAL EXAMINATION  BP 120/74 (BP Location: Left Arm, Patient Position: Sitting, Cuff Size: Large)   Pulse 96   Temp 98.2 F (36.8 C)   Ht 5\' 1"  (1.549 m)   Wt 246 lb 6 oz (111.8 kg)   BMI 46.55 kg/m  Wt Readings from Last 3 Encounters:  06/19/19 246 lb 6 oz (111.8 kg)  06/05/19 253 lb (114.8 kg)  06/01/19 253 lb (114.8 kg)  GEN: NAD, appears stated age, doesn't appear chronically ill PSYCH: Cooperative, without pressured speech EYE: Conjunctivae pink, sclerae anicteric ENT: MMM CV: RR without R/Gs  RESP: Decreased breath sounds at the bases bilaterally but otherwise no adventitious breath sounds present GI: NABS, soft, rounded, NT, without rebound or guarding, hepatosplenomegaly unable to be appreciated due to body habitus MSK/EXT: Trace bilateral lower extremity edema SKIN: No jaundice NEURO:  Alert & Oriented x 3, no focal deficits   REVIEW OF DATA  I reviewed the following data at the time of this encounter:  GI Procedures and Studies  July 2020 colonoscopy - One 30-35 mm polyp in the rectum. Biopsied. - Diverticulosis in the sigmoid colon. - The examination was  otherwise normal on direct and retroflexion views. Diagnosis Surgical [P], colon, rectum, polyp - TUBULAR ADENOMA WITH HIGH-GRADE DYSPLASIA  Laboratory Studies  Reviewed that in epic  Imaging Studies  No relevant studies to review   ASSESSMENT  Ms. Ellinwood is a 53 y.o. female with a pmh significant for hypertension, obesity, prediabetes, anxiety, colorectal polyp (TA with HGD), hemorrhoids.  The patient is seen today for evaluation and management of:  1. Adenomatous rectal polyp   2. Abnormal colonoscopy    Patient is hemodynamically and clinically stable at this time.  Based upon the description and endoscopic pictures I do feel that it is reasonable to pursue an Advanced Polypectomy attempt of the polyp/lesion.  We discussed some of the techniques of advanced polypectomy which include Endoscopic Mucosal Resection, OVESCO Full-Thickness Resection, Endorotor Morcellation, and Tissue Ablation via Fulguration.  The risks and benefits of endoscopic evaluation were discussed with the patient; these include but are not limited to the risk of perforation, infection, bleeding, missed lesions, lack of diagnosis, severe illness requiring hospitalization, as well as anesthesia and sedation related illnesses.  During attempts at advanced polypectomy, the risks of bleeding and perforation/leak are increased as opposed to diagnostic and screening colonoscopies, and that was discussed with the patient as well.   In addition, I explained that with the possible need for piecemeal resection, subsequent short-interval endoscopic evaluation for follow up and potential retreatment of the lesion/area may be necessary.  I did offer, a referral to surgery in order for patient to have opportunity to discuss surgical management/intervention prior to finalizing decision for attempt at endoscopic removal, however, the patient deferred on this.  If, after attempt at removal of the polyp, it is found that the patient has a  complication or that an invasive lesion or malignant lesion is found, or that the polyp continues to recur, the patient is aware and understands that surgery may still be indicated/required.  There is a chance that if this polyp and is involving the dentate line that a surgical approach will be pursued rather than endoscopic.  All patient questions were answered, to the best of my ability, and the patient agrees to the aforementioned plan of action with follow-up as indicated.   PLAN  Laboratories as outlined below Proceed with scheduling colonoscopy with EMR attempt next available   Orders Placed This Encounter  Procedures  . CBC  . Basic Metabolic Panel (BMET)  . INR/PT  . Ambulatory referral to Gastroenterology    New Prescriptions   No medications on file   Modified Medications   No medications on file    Planned Follow Up No follow-ups on file.   Justice Britain, MD [facility] Advanced Endoscopy Office # [phone]

## 2019-07-14 ENCOUNTER — Ambulatory Visit: Payer: BC Managed Care – PPO | Admitting: Gastroenterology

## 2019-07-30 ENCOUNTER — Other Ambulatory Visit (HOSPITAL_COMMUNITY)
Admission: RE | Admit: 2019-07-30 | Discharge: 2019-07-30 | Disposition: A | Discharge status: Home / Self Care | Payer: BC Managed Care – PPO | Source: Ambulatory Visit | Attending: Gastroenterology | Admitting: Gastroenterology

## 2019-07-30 DIAGNOSIS — Z01812 Encounter for preprocedural laboratory examination: Secondary | ICD-10-CM | POA: Insufficient documentation

## 2019-07-30 DIAGNOSIS — Z20828 Contact with and (suspected) exposure to other viral communicable diseases: Secondary | ICD-10-CM | POA: Diagnosis not present

## 2019-07-31 ENCOUNTER — Other Ambulatory Visit: Payer: Self-pay

## 2019-07-31 ENCOUNTER — Encounter (HOSPITAL_COMMUNITY): Payer: Self-pay | Admitting: *Deleted

## 2019-07-31 LAB — NOVEL CORONAVIRUS, NAA (HOSP ORDER, SEND-OUT TO REF LAB; TAT 18-24 HRS): SARS-CoV-2, NAA: NOT DETECTED

## 2019-07-31 NOTE — Progress Notes (Addendum)
Paula Crawford denies chest pain or shortness of breath. Paula Crawford was tested for Covid, 07/30/2019 and has been in quarantine since. Paula Crawford did not have any questions regarding prep. Paula Crawford reported hat she is no longer pre- diabetic, "they say my labs have been good." Patient does not know what last A1C was.  In 2014, OB/GYN , Dr Renelda Loma heard a heart murmer.  Patient reported that she had a ultrasound,she thinks of her heart, but she could'nt remember where she had it.  Patient was not referred to a cardiologist to her knowledge. In the History section, Heart Murmer was listed - 2014- "leaky tricuspid value."  Paula Crawford stated that no one has mentioned it since. I did see it mentioned when patient had Hysterecotmy in 2014 by anesthesiologist. I informed Dr Jenita Seashore of this information.

## 2019-08-03 ENCOUNTER — Encounter (HOSPITAL_COMMUNITY)
Admission: RE | Disposition: A | Discharge status: Home / Self Care | Payer: Self-pay | Source: Home / Self Care | Attending: Gastroenterology

## 2019-08-03 ENCOUNTER — Ambulatory Visit (HOSPITAL_COMMUNITY): Payer: BC Managed Care – PPO | Admitting: Anesthesiology

## 2019-08-03 ENCOUNTER — Ambulatory Visit (HOSPITAL_COMMUNITY)
Admission: RE | Admit: 2019-08-03 | Discharge: 2019-08-03 | Disposition: A | Discharge status: Home / Self Care | Payer: BC Managed Care – PPO | Attending: Gastroenterology | Admitting: Gastroenterology

## 2019-08-03 ENCOUNTER — Other Ambulatory Visit: Payer: Self-pay

## 2019-08-03 ENCOUNTER — Encounter (HOSPITAL_COMMUNITY): Payer: Self-pay | Admitting: Emergency Medicine

## 2019-08-03 DIAGNOSIS — Z885 Allergy status to narcotic agent status: Secondary | ICD-10-CM | POA: Diagnosis not present

## 2019-08-03 DIAGNOSIS — K644 Residual hemorrhoidal skin tags: Secondary | ICD-10-CM | POA: Diagnosis not present

## 2019-08-03 DIAGNOSIS — D126 Benign neoplasm of colon, unspecified: Secondary | ICD-10-CM | POA: Diagnosis not present

## 2019-08-03 DIAGNOSIS — K5730 Diverticulosis of large intestine without perforation or abscess without bleeding: Secondary | ICD-10-CM | POA: Diagnosis not present

## 2019-08-03 DIAGNOSIS — D128 Benign neoplasm of rectum: Secondary | ICD-10-CM | POA: Diagnosis not present

## 2019-08-03 DIAGNOSIS — K640 First degree hemorrhoids: Secondary | ICD-10-CM | POA: Diagnosis not present

## 2019-08-03 DIAGNOSIS — I10 Essential (primary) hypertension: Secondary | ICD-10-CM | POA: Insufficient documentation

## 2019-08-03 DIAGNOSIS — K621 Rectal polyp: Secondary | ICD-10-CM | POA: Diagnosis not present

## 2019-08-03 DIAGNOSIS — Z6841 Body Mass Index (BMI) 40.0 and over, adult: Secondary | ICD-10-CM | POA: Insufficient documentation

## 2019-08-03 HISTORY — PX: ENDOSCOPIC MUCOSAL RESECTION: SHX6839

## 2019-08-03 HISTORY — PX: SUBMUCOSAL LIFTING INJECTION: SHX6855

## 2019-08-03 HISTORY — PX: HEMOSTASIS CLIP PLACEMENT: SHX6857

## 2019-08-03 HISTORY — PX: FLEXIBLE SIGMOIDOSCOPY: SHX5431

## 2019-08-03 HISTORY — PX: SCLEROTHERAPY: SHX6841

## 2019-08-03 HISTORY — DX: Melena: K92.1

## 2019-08-03 SURGERY — RESECTION, MUCOSAL LESION, GI TRACT, ENDOSCOPIC
Anesthesia: Monitor Anesthesia Care

## 2019-08-03 MED ORDER — LIDOCAINE 2% (20 MG/ML) 5 ML SYRINGE
INTRAMUSCULAR | Status: DC | PRN
  Administered 2019-08-03: 50 mg via INTRAVENOUS

## 2019-08-03 MED ORDER — LACTATED RINGERS IV SOLN
INTRAVENOUS | Status: DC | PRN
  Administered 2019-08-03 (×2): via INTRAVENOUS

## 2019-08-03 MED ORDER — PHENYLEPHRINE 40 MCG/ML (10ML) SYRINGE FOR IV PUSH (FOR BLOOD PRESSURE SUPPORT)
PREFILLED_SYRINGE | INTRAVENOUS | Status: DC | PRN
  Administered 2019-08-03 (×3): 80 ug via INTRAVENOUS

## 2019-08-03 MED ORDER — LACTATED RINGERS IV SOLN
INTRAVENOUS | Status: DC
  Administered 2019-08-03: 11:00:00 via INTRAVENOUS

## 2019-08-03 MED ORDER — SODIUM CHLORIDE 0.9 % IV SOLN: INTRAVENOUS | Status: DC

## 2019-08-03 MED ORDER — EPINEPHRINE 1 MG/10ML IJ SOSY
PREFILLED_SYRINGE | INTRAMUSCULAR | Status: AC
  Filled 2019-08-03: qty 10

## 2019-08-03 MED ORDER — LIDOCAINE VISCOUS HCL 2 % MT SOLN
OROMUCOSAL | Status: AC
  Filled 2019-08-03: qty 15

## 2019-08-03 MED ORDER — LIDOCAINE VISCOUS HCL 2 % MT SOLN
OROMUCOSAL | Status: DC | PRN
  Administered 2019-08-03: 20 mL

## 2019-08-03 MED ORDER — PROPOFOL 10 MG/ML IV BOLUS
INTRAVENOUS | Status: DC | PRN
  Administered 2019-08-03: 20 mg via INTRAVENOUS
  Administered 2019-08-03: 40 mg via INTRAVENOUS
  Administered 2019-08-03 (×2): 20 mg via INTRAVENOUS
  Administered 2019-08-03: 60 mg via INTRAVENOUS

## 2019-08-03 MED ORDER — PROPOFOL 500 MG/50ML IV EMUL
INTRAVENOUS | Status: DC | PRN
  Administered 2019-08-03: 14:00:00 via INTRAVENOUS
  Administered 2019-08-03: 100 ug/kg/min via INTRAVENOUS

## 2019-08-03 MED ORDER — SODIUM CHLORIDE (PF) 0.9 % IJ SOLN
PREFILLED_SYRINGE | INTRAMUSCULAR | Status: DC | PRN
  Administered 2019-08-03: 5 mL

## 2019-08-03 SURGICAL SUPPLY — 22 items

## 2019-08-03 NOTE — Anesthesia Postprocedure Evaluation (Signed)
Anesthesia Post Note  Patient: Paula Crawford  Procedure(s) Performed: COLONOSCOPY WITH PROPOFOL (N/A ) ENDOSCOPIC MUCOSAL RESECTION (N/A ) SCLEROTHERAPY POLYPECTOMY HEMOSTASIS CLIP PLACEMENT     Patient location during evaluation: Endoscopy Anesthesia Type: MAC Level of consciousness: awake and alert Pain management: pain level controlled Vital Signs Assessment: post-procedure vital signs reviewed and stable Respiratory status: spontaneous breathing, nonlabored ventilation, respiratory function stable and patient connected to nasal cannula oxygen Cardiovascular status: blood pressure returned to baseline and stable Postop Assessment: no apparent nausea or vomiting Anesthetic complications: no    Last Vitals:  Vitals:   08/03/19 1354 08/03/19 1404  BP: 117/75 132/81  Pulse: 87 82  Resp: 10 15  Temp: 36.6 C   SpO2: 100% 98%    Last Pain:  Vitals:   08/03/19 1404  TempSrc:   PainSc: 0-No pain                 Chelsey L Woodrum

## 2019-08-03 NOTE — Anesthesia Preprocedure Evaluation (Addendum)
Anesthesia Evaluation  Patient identified by MRN, date of birth, ID band Patient awake    Reviewed: Allergy & Precautions, NPO status , Patient's Chart, lab work & pertinent test results  Airway Mallampati: I  TM Distance: >3 FB Neck ROM: Full    Dental no notable dental hx. (+) Chipped, Dental Advisory Given,    Pulmonary neg pulmonary ROS,    Pulmonary exam normal breath sounds clear to auscultation       Cardiovascular hypertension, Pt. on medications Normal cardiovascular exam+ Valvular Problems/Murmurs (TR )  Rhythm:Regular Rate:Normal     Neuro/Psych PSYCHIATRIC DISORDERS Anxiety negative neurological ROS     GI/Hepatic negative GI ROS, Neg liver ROS,   Endo/Other  Morbid obesity  Renal/GU negative Renal ROS  negative genitourinary   Musculoskeletal negative musculoskeletal ROS (+)   Abdominal   Peds  Hematology negative hematology ROS (+)   Anesthesia Other Findings Colonoscopy for rectal polyp  Reproductive/Obstetrics                            Anesthesia Physical Anesthesia Plan  ASA: III  Anesthesia Plan: MAC   Post-op Pain Management:    Induction: Intravenous  PONV Risk Score and Plan: 2 and Propofol infusion and Treatment may vary due to age or medical condition  Airway Management Planned: Natural Airway  Additional Equipment:   Intra-op Plan:   Post-operative Plan:   Informed Consent: I have reviewed the patients History and Physical, chart, labs and discussed the procedure including the risks, benefits and alternatives for the proposed anesthesia with the patient or authorized representative who has indicated his/her understanding and acceptance.     Dental advisory given  Plan Discussed with: CRNA  Anesthesia Plan Comments:         Anesthesia Quick Evaluation

## 2019-08-03 NOTE — H&P (Signed)
GASTROENTEROLOGY PROCEDURE H&P NOTE   Primary Care Physician: Leonides Sake, MD  HPI: Paula Crawford is a 53 y.o. female who presents for Sigmoidoscopy with EMR attempt  Past Medical History:  Diagnosis Date  . Anemia   . Anxiety   . Blood in stool   . 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 Hypertension   . Insomnia   . Obese   . Prediabetes    Past Surgical History:  Procedure Laterality Date  . ABDOMINAL HYSTERECTOMY N/A 07/08/2013   Procedure: HYSTERECTOMY ABDOMINAL;  Surgeon: Eldred Manges, MD;  Location: [facility];  Service: Gynecology;  Laterality: N/A;  . CESAREAN SECTION    . COLONOSCOPY    . TOTAL ABDOMINAL HYSTERECTOMY  2014   Current Facility-Administered Medications  Medication Dose Route Frequency Provider Last Rate Last Dose  . lactated ringers infusion   Intravenous Continuous Mansouraty, Telford Nab., MD       Allergies  Allergen Reactions  . Morphine And Related Itching   Family History  Problem Relation Age of Onset  . Heart disease Mother   . Heart murmur Mother   . Healthy Son   . Colon cancer Neg Hx   . Esophageal cancer Neg Hx   . Inflammatory bowel disease Neg Hx   . Liver disease Neg Hx   . Pancreatic cancer Neg Hx   . Rectal cancer Neg Hx   . Stomach cancer Neg Hx    Social History   Socioeconomic History  . Marital status: Married    Spouse name: Not on file  . Number of children: Not on file  . Years of education: Not on file  . Highest education level: Not on file  Occupational History  . Not on file  Social Needs  . Financial resource strain: Not on file  . Food insecurity    Worry: Not on file    Inability: Not on file  . Transportation needs    Medical: Not on file    Non-medical: Not on file  Tobacco Use  . Smoking status: Never Smoker  . Smokeless tobacco: Never Used  Substance and Sexual Activity  . Alcohol use: Yes    Alcohol/week: 2.0 standard drinks    Types: 2 Glasses of wine  per week    Comment: occ   . Drug use: No  . Sexual activity: Not on file  Lifestyle  . Physical activity    Days per week: Not on file    Minutes per session: Not on file  . Stress: Not on file  Relationships  . Social Herbalist on phone: Not on file    Gets together: Not on file    Attends religious service: Not on file    Active member of club or organization: Not on file    Attends meetings of clubs or organizations: Not on file    Relationship status: Not on file  . Intimate partner violence    Fear of current or ex partner: Not on file    Emotionally abused: Not on file    Physically abused: Not on file    Forced sexual activity: Not on file  Other Topics Concern  . Not on file  Social History Narrative  . Not on file    Physical Exam: Vital signs in last 24 hours: Temp:  [98.3 F (36.8 C)] 98.3 F (36.8 C) (09/28 1054) Pulse Rate:  [105] 105 (09/28 1054)  Resp:  [28] 28 (09/28 1054) BP: (147)/(88) 147/88 (09/28 1054) SpO2:  [98 %] 98 % (09/28 1054) Weight:  [108.9 kg] 108.9 kg (09/28 1054)   GEN: NAD EYE: Sclerae anicteric ENT: MMM CV: Non-tachycardic GI: Soft, NT/ND NEURO:  Alert & Oriented x 3  Lab Results: No results for input(s): WBC, HGB, HCT, PLT in the last 72 hours. BMET No results for input(s): NA, K, CL, CO2, GLUCOSE, BUN, CREATININE, CALCIUM in the last 72 hours. LFT No results for input(s): PROT, ALBUMIN, AST, ALT, ALKPHOS, BILITOT, BILIDIR, IBILI in the last 72 hours. PT/INR No results for input(s): LABPROT, INR in the last 72 hours.   Impression / Plan: This is a 53 y.o.female who presents for Sigmoidoscopy with EMR attempt.  The risks and benefits of endoscopic evaluation were discussed with the patient; these include but are not limited to the risk of perforation, infection, bleeding, missed lesions, lack of diagnosis, severe illness requiring hospitalization, as well as anesthesia and sedation related illnesses.  The patient  is agreeable to proceed.    Justice Britain, MD [facility] Office # [phone]

## 2019-08-03 NOTE — Anesthesia Procedure Notes (Signed)
Procedure Name: MAC Date/Time: 08/03/2019 12:36 PM Performed by: Orlie Dakin, CRNA Pre-anesthesia Checklist: Patient identified, Emergency Drugs available, Suction available, Patient being monitored and Timeout performed Patient Re-evaluated:Patient Re-evaluated prior to induction Oxygen Delivery Method: Simple face mask Preoxygenation: Pre-oxygenation with 100% oxygen Induction Type: IV induction

## 2019-08-03 NOTE — Op Note (Addendum)
[facility] Patient Name: Paula Crawford Procedure Date : 08/03/2019 MRN: [id number] Attending MD: Justice Britain , MD Date of Birth: Mar 25, 1966 CSN: [id number] Age: 53 Admit Type: Outpatient Procedure:                Flexible Sigmoidoscopy Indications:              Adenomatous polyps in the colon, For therapy of                            adenomatous polyps in the colon Providers:                Justice Britain, MD, Jeanella Cara, RN,                            Elspeth Cho Tech., Technician, Vania Rea, CRNA Referring MD:             Estill Cotta. Loletha Carrow, MD Medicines:                Monitored Anesthesia Care Complications:            No immediate complications. Estimated Blood Loss:     Estimated blood loss was minimal. Procedure:                Pre-Anesthesia Assessment:                           - Prior to the procedure, a History and Physical                            was performed, and patient medications and                            allergies were reviewed. The patient's tolerance of                            previous anesthesia was also reviewed. The risks                            and benefits of the procedure and the sedation                            options and risks were discussed with the patient.                            All questions were answered, and informed consent                            was obtained. Prior Anticoagulants: The patient has                            taken no previous anticoagulant or antiplatelet                            agents. ASA Grade Assessment: III - A patient with  severe systemic disease. After reviewing the risks                            and benefits, the patient was deemed in                            satisfactory condition to undergo the procedure.                           After obtaining informed consent, the scope was                            passed under direct  vision. The [id number]-[id number]                            ([id number]) Olympus therapeutic gastroscope was                            introduced through the anus and advanced to the the                            left transverse colon. After obtaining informed                            consent, the scope was passed under direct                            vision.The flexible sigmoidoscopy was accomplished                            without difficulty. The patient tolerated the                            procedure. Scope In: 12:41:22 PM Scope Out: 1:46:43 PM Total Procedure Duration: 1 hour 5 minutes 20 seconds  Findings:      The digital rectal exam findings include palpable rectal nodularity and       hemorrhoids.      A 35 mm polyp was found in the rectum very near the dentate line. The       polyp was semi-sessile. Preparations were made for mucosal resection.       Orise gel was injected to raise the lesion. Piecemeal mucosal resection       using a snare was performed. Resection and retrieval were complete. The       resection margin was approximately 45-50 mm in size at the end of the       resection. Area was successfully injected with 1 mL of a 1:10,000       solution of epinephrine for hemostasis and 4 mL of additional       epinephrine were sprayed onto the resection bed. To prevent bleeding       after mucosal resection, four hemostatic clips were successfully placed       (MR conditional) at regions where small amount of mucosal oozing was       noted. There was no bleeding at the end of the procedure.  A few small-mouthed diverticula were found in the recto-sigmoid colon       and sigmoid colon.      Normal mucosa was found in the entire colon otherwise.      Non-bleeding non-thrombosed external and internal hemorrhoids were found       during retroflexion, during perianal exam and during digital exam. The       hemorrhoids were Grade I (internal hemorrhoids that do not  prolapse). Impression:               - Palpable rectal nodularity and hemorrhoids found                            on digital rectal exam.                           - One 35 mm polyp in the rectum, removed with                            piecemeal mucosal resection. Resected and                            retrieved. Injected. Clips (MR conditional) were                            placed.                           - Diverticulosis in the recto-sigmoid colon and in                            the sigmoid colon.                           - Normal mucosa in the entire examined colon                            otherwise.                           - Non-bleeding non-thrombosed external and internal                            hemorrhoids. Recommendation:           - The patient will be observed post-procedure,                            until all discharge criteria are met.                           - Discharge patient to home.                           - Monitor for signs/symptoms of bleeding,                            perforation, and infection. If issues please call  our number to get further assistance as needed.                           - Patient will likely have slight oozing/bleeding                            for the next 24-48 hours. With the type of                            resection performed in regards to cold piecemeal, I                            am hopeful that we will minimize need for                            re-intervention. The entirety of the endoscopic                            resection margin is too large for typical                            endoscopic closure.                           - Colace 200 mg BID for next week to ensure stools                            are soft.                           - Miralax BID PRN to ensure bowel movements daily                            and no straining.                           - Preparation H  cream or ointment to the perineum                            and potentially to the first knuckle should be used                            to help decrease inflammation from adjacent                            hemorrhoids.                           - Await pathology results.                           - Repeat flexible sigmoidoscopy in 6 months for                            surveillance after  piecemeal polyp resection.                           - Please minimize Non-steroidal medications and                            Aspirin for at least the next 1-2 weeks to decrease                            risk of bleeding post-mucosectomy.                           - The findings and recommendations were discussed                            with the patient.                           - The findings and recommendations were discussed                            with the patient's family. Procedure Code(s):        --- Professional ---                           [phone], Sigmoidoscopy, flexible; with endoscopic                            mucosal resection Diagnosis Code(s):        --- Professional ---                           K62.89, Other specified diseases of anus and rectum                           K64.0, First degree hemorrhoids                           K62.1, Rectal polyp                           D12.6, Benign neoplasm of colon, unspecified                           K57.30, Diverticulosis of large intestine without                            perforation or abscess without bleeding CPT copyright 2019 American Medical Association. All rights reserved. The codes documented in this report are preliminary and upon coder review may  be revised to meet current compliance requirements. Justice Britain, MD 08/03/2019 2:06:39 PM Number of Addenda: 0

## 2019-08-03 NOTE — Transfer of Care (Signed)
Immediate Anesthesia Transfer of Care Note  Patient: Paula Crawford  Procedure(s) Performed: COLONOSCOPY WITH PROPOFOL (N/A ) ENDOSCOPIC MUCOSAL RESECTION (N/A ) SCLEROTHERAPY POLYPECTOMY HEMOSTASIS CLIP PLACEMENT  Patient Location: Endoscopy Unit  Anesthesia Type:MAC  Level of Consciousness: drowsy and patient cooperative  Airway & Oxygen Therapy: Patient Spontanous Breathing and Patient connected to face mask oxygen  Post-op Assessment: Report given to RN and Post -op Vital signs reviewed and stable  Post vital signs: Reviewed and stable  Last Vitals:  Vitals Value Taken Time  BP    Temp    Pulse    Resp    SpO2      Last Pain:  Vitals:   08/03/19 1054  TempSrc: Oral  PainSc: 0-No pain         Complications: No apparent anesthesia complications

## 2019-08-03 NOTE — Discharge Instructions (Signed)
PLEASE HOLD ON USE OF ASPIRIN CONTAINING PRODUCTS AND NON-STEROIDAL MEDICATIONS FOR AT LEAST THE NEXT 1-2 WEEKS AS ABLE.  THIS WILL HELP DECREASE RISK OF POST-PROCEDURAL BLEEDING.   YOU HAD AN ENDOSCOPIC PROCEDURE TODAY: Refer to the procedure report and other information in the discharge instructions given to you for any specific questions about what was found during the examination. If this information does not answer your questions, please call [place] office at [phone] to clarify.   YOU SHOULD EXPECT: Some feelings of bloating in the abdomen. Passage of more gas than usual. Walking can help get rid of the air that was put into your GI tract during the procedure and reduce the bloating. If you had a lower endoscopy (such as a colonoscopy or flexible sigmoidoscopy) you may notice spotting of blood in your stool or on the toilet paper. Some abdominal soreness may be present for a day or two, also.  DIET: Your first meal following the procedure should be a light meal and then it is ok to progress to your normal diet. A half-sandwich or bowl of soup is an example of a good first meal. Heavy or fried foods are harder to digest and may make you feel nauseous or bloated. Drink plenty of fluids but you should avoid alcoholic beverages for 24 hours. If you had a esophageal dilation, please see attached instructions for diet.    ACTIVITY: Your care partner should take you home directly after the procedure. You should plan to take it easy, moving slowly for the rest of the day. You can resume normal activity the day after the procedure however YOU SHOULD NOT DRIVE, use power tools, machinery or perform tasks that involve climbing or major physical exertion for 24 hours (because of the sedation medicines used during the test).   SYMPTOMS TO REPORT IMMEDIATELY: A gastroenterologist can be reached at any hour. Please call [phone]  for any of the following symptoms:  Following lower endoscopy  (colonoscopy, flexible sigmoidoscopy) Excessive amounts of blood in the stool  Significant tenderness, worsening of abdominal pains  Swelling of the abdomen that is new, acute  Fever of 100 or higher    FOLLOW UP:  If any biopsies were taken you will be contacted by phone or by letter within the next 1-3 weeks. Call [phone]  if you have not heard about the biopsies in 3 weeks.  Please also call with any specific questions about appointments or follow up tests.

## 2019-08-04 ENCOUNTER — Telehealth: Payer: Self-pay | Admitting: Gastroenterology

## 2019-08-04 ENCOUNTER — Encounter: Payer: Self-pay | Admitting: Gastroenterology

## 2019-08-04 ENCOUNTER — Encounter (HOSPITAL_COMMUNITY): Payer: Self-pay | Admitting: Gastroenterology

## 2019-08-04 LAB — SURGICAL PATHOLOGY

## 2019-08-04 NOTE — Telephone Encounter (Signed)
I tried to reach out to the patient on her home number, her cell number, her husband's cell number checking and see how she was doing today post large rectal EMR. Not able to touch base with her. We will have advanced RN Gerarda Fraction attempt to reach out to patient later today and see how she is doing.  Justice Britain, MD [facility] Office # [phone]

## 2019-08-04 NOTE — Telephone Encounter (Signed)
I called and spoke with the pt and she tells me that she is doing very well.  She has no complaints at this time.  She was advised to call if she has any concerns

## 2019-08-04 NOTE — Progress Notes (Signed)
Attempted, unable to leave message 

## 2019-08-26 ENCOUNTER — Encounter (HOSPITAL_BASED_OUTPATIENT_CLINIC_OR_DEPARTMENT_OTHER): Payer: Self-pay | Admitting: *Deleted

## 2019-08-26 ENCOUNTER — Other Ambulatory Visit: Payer: Self-pay

## 2019-08-27 NOTE — H&P (Signed)
Paula Crawford Location: [facility] Patient #: [id number] DOB: 1966-01-01 Married / Language: English / Race: Black or African American Female      History of present illness     . This is a 53 year old woman, referred by Dr. Franki Cabot at [facility] and Dr. Lisbeth Ply for evaluation of complex sclerosing lesion right breast, lower outer quadrant. She has no prior history of breast problems. Recent screening mammogram show a 1 cm area of mass and distortion in the right breast, 8:30 position, 10 cm from the nipple. Left breast looks fine. Core biopsy of the right breast complex sclerosing lesion. Excision was recommended. The patient wants to have this done      Past history significant for hypertension. C-section. Abdominal hysterectomy without BSO. BMI 47. Takes a baby aspirin Family history reveals mother had heart disease. Father living. There are no cancers syndromes in the family She is married and lives in [place]. 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 Denies tobacco. Drinks alcohol occasionally        I explained the histology of this lesion to her. I explained it is a high risk lesion occasionally 4-9% risk of early breast cancer on complete excision. She understands that most likely she does not have cancer. She would like to have this area excised to clarify her diagnosis and I agree. She will be scheduled for right breast lumpectomy with radioactive seed localization. I have discussed indications, details, techniques, and risk of the surgery will with her in detail. She is aware of the risks of bleeding, infection, chronic pain from nerve damage, cosmetic deformity, reoperation if cancer. She understands all of these issues. All of her questions were answered. She agrees with this plan.   Past Surgical History  Breast Biopsy  Right. Cesarean Section - 1  Hysterectomy (not due to cancer) - Partial   Diagnostic Studies History  Mammogram   within last year Pap Smear  1-5 years ago  Allergies Morphine Sulfate (PF) *ANALGESICS - OPIOID*  Allergies Reconciled   Medication History  Amitriptyline HCl (25MG  Tablet, Oral) Active. Aspirin (81MG  Tablet, Oral) Active. hydroCHLOROthiazide (25MG  Tablet, Oral) Active. Losartan Potassium (100MG  Tablet, Oral) Active. Medications Reconciled  Social History  Alcohol use  Occasional alcohol use. Caffeine use  Carbonated beverages, Tea. No drug use  Tobacco use  Never smoker.  Family History Alcohol Abuse  Family Members In General. Arthritis  Mother. Diabetes Mellitus  Mother. Hypertension  Brother, Mother.  Pregnancy / Birth History Age at menarche  21 years. Age of menopause  38-50 Contraceptive History  Oral contraceptives. Gravida  1 Irregular periods  Maternal age  37-30 Para  1  Other Back Pain  High blood pressure     Review of Systems General Present- Night Sweats. Not Present- Appetite Loss, Chills, Fatigue, Fever, Weight Gain and Weight Loss. HEENT Present- Wears glasses/contact lenses. Not Present- Earache, Hearing Loss, Hoarseness, Nose Bleed, Oral Ulcers, Ringing in the Ears, Seasonal Allergies, Sinus Pain, Sore Throat, Visual Disturbances and Yellow Eyes. Respiratory Not Present- Bloody sputum, Chronic Cough, Difficulty Breathing, Snoring and Wheezing. Cardiovascular Present- Swelling of Extremities. Not Present- Chest Pain, Difficulty Breathing Lying Down, Leg Cramps, Palpitations, Rapid Heart Rate and Shortness of Breath. Gastrointestinal Not Present- Abdominal Pain, Bloating, Bloody Stool, Change in Bowel Habits, Chronic diarrhea, Constipation, Difficulty Swallowing, Excessive gas, Gets full quickly at meals, Hemorrhoids, Indigestion, Nausea, Rectal Pain and Vomiting. Female Genitourinary Not Present- Frequency, Nocturia, Painful Urination, Pelvic Pain and Urgency. Neurological  Not Present- Decreased Memory, Fainting, Headaches,  Numbness, Seizures, Tingling, Tremor, Trouble walking and Weakness. Psychiatric Not Present- Anxiety, Bipolar, Change in Sleep Pattern, Depression, Fearful and Frequent crying. Endocrine Not Present- Cold Intolerance, Excessive Hunger, Hair Changes, Heat Intolerance, Hot flashes and New Diabetes. Hematology Not Present- Blood Thinners, Easy Bruising, Excessive bleeding, Gland problems, HIV and Persistent Infections.  Vitals Weight: 248.8 lb Height: 61in Body Surface Area: 2.07 m Body Mass Index: 47.01 kg/m  Temp.: 98.83F  Pulse: 103 (Regular)  BP: 132/84 (Sitting, Left Arm, Standard)       Physical Exam General Mental Status-Alert. General Appearance-Consistent with stated age. Hydration-Well hydrated. Voice-Normal.  Head and Neck Head-normocephalic, atraumatic with no lesions or palpable masses. Trachea-midline. Thyroid Gland Characteristics - normal size and consistency.  Eye Eyeball - Bilateral-Extraocular movements intact. Sclera/Conjunctiva - Bilateral-No scleral icterus.  Chest and Lung Exam Chest and lung exam reveals -quiet, even and easy respiratory effort with no use of accessory muscles and on auscultation, normal breath sounds, no adventitious sounds and normal vocal resonance. Inspection Chest Wall - Normal. Back - normal.  Breast Note: Breasts are relatively large. Symmetrical. No skin change. Biopsy mark right breast inferolateral. No palpable mass or adenopathy on either side.   Cardiovascular Cardiovascular examination reveals -normal heart sounds, regular rate and rhythm with no murmurs and normal pedal pulses bilaterally.  Abdomen Inspection Inspection of the abdomen reveals - No Hernias. Skin - Scar - Note: Pfannenstiel incision. Palpation/Percussion Palpation and Percussion of the abdomen reveal - Soft, Non Tender, No Rebound tenderness, No Rigidity (guarding) and No  hepatosplenomegaly. Auscultation Auscultation of the abdomen reveals - Bowel sounds normal.  Neurologic Neurologic evaluation reveals -alert and oriented x 3 with no impairment of recent or remote memory. Mental Status-Normal.  Musculoskeletal Normal Exam - Left-Upper Extremity Strength Normal and Lower Extremity Strength Normal. Normal Exam - Right-Upper Extremity Strength Normal and Lower Extremity Strength Normal.  Lymphatic Head & Neck  General Head & Neck Lymphatics: Bilateral - Description - Normal. Axillary  General Axillary Region: Bilateral - Description - Normal. Tenderness - Non Tender. Femoral & Inguinal  Generalized Femoral & Inguinal Lymphatics: Bilateral - Description - Normal. Tenderness - Non Tender.     Assessment & Plan ABNORMALITY OF RIGHT BREAST ON SCREENING MAMMOGRAM (R92.8)  Your recent mammograms show an area of distortion in the right breast, lower outer quadrant, 1 cm in diameter. This is in the lateral part of the breast, several inches away from the nipple The left breast looks fine Biopsy shows a condition called complex sclerosing lesion Most likely you do not have cancer, but there is somewhere between a 4-9% chance that you could have early breast cancer associated with this Excision of this area is recommended to clarify your diagnosis You agree with this  you will be scheduled for right breast lipectomy with radioactive seed localization Dr. Dalbert Batman has discussed the indications, techniques, and risk of the surgery with you in detail  HYPERTENSION, ESSENTIAL, BENIGN (I10) BMI 45.0-49.9, ADULT (Z68.42) H/O ABDOMINAL HYSTERECTOMY (Z90.710) Impression: Ovaries were not removed    Paula Crawford. Dalbert Batman, M.D., [facility] General and Minimally invasive Surgery Breast and Colorectal Surgery Office:   [phone]

## 2019-08-28 ENCOUNTER — Other Ambulatory Visit: Payer: Self-pay

## 2019-08-28 ENCOUNTER — Encounter (HOSPITAL_BASED_OUTPATIENT_CLINIC_OR_DEPARTMENT_OTHER)
Admission: RE | Admit: 2019-08-28 | Discharge: 2019-08-28 | Disposition: A | Discharge status: Home / Self Care | Payer: BC Managed Care – PPO | Source: Ambulatory Visit | Attending: General Surgery | Admitting: General Surgery

## 2019-08-28 DIAGNOSIS — Z01812 Encounter for preprocedural laboratory examination: Secondary | ICD-10-CM | POA: Diagnosis present

## 2019-08-28 LAB — CBC WITH DIFFERENTIAL/PLATELET
Abs Immature Granulocytes: 0.02 10*3/uL (ref 0.00–0.07)
Basophils Absolute: 0 10*3/uL (ref 0.0–0.1)
Basophils Relative: 1 %
Eosinophils Absolute: 0.1 10*3/uL (ref 0.0–0.5)
Eosinophils Relative: 2 %
HCT: 40.6 % (ref 36.0–46.0)
Hemoglobin: 13 g/dL (ref 12.0–15.0)
Immature Granulocytes: 0 %
Lymphocytes Relative: 37 %
Lymphs Abs: 2.2 10*3/uL (ref 0.7–4.0)
MCH: 27.4 pg (ref 26.0–34.0)
MCHC: 32 g/dL (ref 30.0–36.0)
MCV: 85.7 fL (ref 80.0–100.0)
Monocytes Absolute: 0.4 10*3/uL (ref 0.1–1.0)
Monocytes Relative: 7 %
Neutro Abs: 3.2 10*3/uL (ref 1.7–7.7)
Neutrophils Relative %: 53 %
Platelets: 324 10*3/uL (ref 150–400)
RBC: 4.74 MIL/uL (ref 3.87–5.11)
RDW: 13.6 % (ref 11.5–15.5)
WBC: 5.9 10*3/uL (ref 4.0–10.5)
nRBC: 0 % (ref 0.0–0.2)

## 2019-08-28 LAB — COMPREHENSIVE METABOLIC PANEL
ALT: 13 U/L (ref 0–44)
AST: 17 U/L (ref 15–41)
Albumin: 3.7 g/dL (ref 3.5–5.0)
Alkaline Phosphatase: 85 U/L (ref 38–126)
Anion gap: 12 (ref 5–15)
BUN: 8 mg/dL (ref 6–20)
CO2: 26 mmol/L (ref 22–32)
Calcium: 10.1 mg/dL (ref 8.9–10.3)
Chloride: 101 mmol/L (ref 98–111)
Creatinine, Ser: 0.71 mg/dL (ref 0.44–1.00)
GFR calc Af Amer: >60 mL/min (ref >60)
GFR calc non Af Amer: >60 mL/min (ref >60)
Glucose, Bld: 96 mg/dL (ref 70–99)
Potassium: 3.1 mmol/L — ABNORMAL LOW (ref 3.5–5.1)
Sodium: 139 mmol/L (ref 135–145)
Total Bilirubin: 0.4 mg/dL (ref 0.3–1.2)
Total Protein: 6.9 g/dL (ref 6.5–8.1)

## 2019-08-28 MED ORDER — CHLORHEXIDINE GLUCONATE CLOTH 2 % EX PADS: 6.0000 | MEDICATED_PAD | Freq: Once | CUTANEOUS | Status: DC

## 2019-08-28 NOTE — Anesthesia Preprocedure Evaluation (Addendum)
Anesthesia Evaluation  Patient identified by MRN, date of birth, ID band Patient awake    Reviewed: Allergy & Precautions, NPO status , Patient's Chart, lab work & pertinent test results  Airway Mallampati: I  TM Distance: >3 FB Neck ROM: Full    Dental no notable dental hx. (+) Chipped, Dental Advisory Given,    Pulmonary neg pulmonary ROS,    Pulmonary exam normal breath sounds clear to auscultation       Cardiovascular hypertension, Pt. on medications Normal cardiovascular exam+ Valvular Problems/Murmurs (TR )  Rhythm:Regular Rate:Normal     Neuro/Psych PSYCHIATRIC DISORDERS Anxiety negative neurological ROS     GI/Hepatic negative GI ROS, Neg liver ROS,   Endo/Other  Morbid obesityBMI 47 Pre-diabetic  Renal/GU negative Renal ROS   Uterine fibroids, menorrhagia s/p TAH 2014    Musculoskeletal negative musculoskeletal ROS (+)   Abdominal Normal abdominal exam  (+) + obese,   Peds  Hematology  (+) anemia ,   Anesthesia Other Findings   Reproductive/Obstetrics negative OB ROS                             Anesthesia Physical  Anesthesia Plan  ASA: III  Anesthesia Plan: General   Post-op Pain Management:    Induction: Intravenous  PONV Risk Score and Plan: 2  Airway Management Planned: LMA  Additional Equipment: None  Intra-op Plan:   Post-operative Plan: Extubation in OR  Informed Consent: I have reviewed the patients History and Physical, chart, labs and discussed the procedure including the risks, benefits and alternatives for the proposed anesthesia with the patient or authorized representative who has indicated his/her understanding and acceptance.     Dental advisory given  Plan Discussed with: CRNA  Anesthesia Plan Comments: (Seen as anesthesia consult 10/23 for BMI. Normal airway exam.)        Anesthesia Quick Evaluation

## 2019-08-29 ENCOUNTER — Other Ambulatory Visit (HOSPITAL_COMMUNITY)
Admission: RE | Admit: 2019-08-29 | Discharge: 2019-08-29 | Disposition: A | Discharge status: Home / Self Care | Payer: BC Managed Care – PPO | Source: Ambulatory Visit | Attending: General Surgery | Admitting: General Surgery

## 2019-08-29 DIAGNOSIS — Z01812 Encounter for preprocedural laboratory examination: Secondary | ICD-10-CM | POA: Diagnosis not present

## 2019-08-29 DIAGNOSIS — Z20828 Contact with and (suspected) exposure to other viral communicable diseases: Secondary | ICD-10-CM | POA: Diagnosis not present

## 2019-08-30 LAB — NOVEL CORONAVIRUS, NAA (HOSP ORDER, SEND-OUT TO REF LAB; TAT 18-24 HRS): SARS-CoV-2, NAA: NOT DETECTED

## 2019-08-31 NOTE — Progress Notes (Signed)
PAT labs reviewed with Dr. Roanna Banning re K 3.1, will proceed with scheduled procedure.  Notified patient and reiterated the importance of not taking any medications/diuretic DOS.  Also advised her to contact her PCP re K level for possible further evaluation.  Pt verbalized understanding.

## 2019-09-01 ENCOUNTER — Ambulatory Visit
Admission: RE | Admit: 2019-09-01 | Discharge: 2019-09-01 | Disposition: A | Discharge status: Home / Self Care | Payer: BC Managed Care – PPO | Source: Ambulatory Visit | Attending: General Surgery | Admitting: General Surgery

## 2019-09-01 ENCOUNTER — Other Ambulatory Visit: Payer: Self-pay

## 2019-09-01 DIAGNOSIS — R928 Other abnormal and inconclusive findings on diagnostic imaging of breast: Secondary | ICD-10-CM

## 2019-09-02 ENCOUNTER — Ambulatory Visit
Admission: RE | Admit: 2019-09-02 | Discharge: 2019-09-02 | Disposition: A | Discharge status: Home / Self Care | Payer: BC Managed Care – PPO | Source: Ambulatory Visit | Attending: General Surgery | Admitting: General Surgery

## 2019-09-02 ENCOUNTER — Ambulatory Visit (HOSPITAL_BASED_OUTPATIENT_CLINIC_OR_DEPARTMENT_OTHER)
Admission: RE | Admit: 2019-09-02 | Discharge: 2019-09-02 | Disposition: A | Discharge status: Home / Self Care | Payer: BC Managed Care – PPO | Attending: General Surgery | Admitting: General Surgery

## 2019-09-02 ENCOUNTER — Encounter (HOSPITAL_BASED_OUTPATIENT_CLINIC_OR_DEPARTMENT_OTHER)
Admission: RE | Disposition: A | Discharge status: Home / Self Care | Payer: Self-pay | Source: Home / Self Care | Attending: General Surgery

## 2019-09-02 ENCOUNTER — Ambulatory Visit (HOSPITAL_BASED_OUTPATIENT_CLINIC_OR_DEPARTMENT_OTHER): Payer: BC Managed Care – PPO | Admitting: Anesthesiology

## 2019-09-02 ENCOUNTER — Encounter (HOSPITAL_BASED_OUTPATIENT_CLINIC_OR_DEPARTMENT_OTHER): Payer: Self-pay | Admitting: Certified Registered"

## 2019-09-02 ENCOUNTER — Other Ambulatory Visit: Payer: Self-pay

## 2019-09-02 DIAGNOSIS — Z7982 Long term (current) use of aspirin: Secondary | ICD-10-CM | POA: Insufficient documentation

## 2019-09-02 DIAGNOSIS — Z79899 Other long term (current) drug therapy: Secondary | ICD-10-CM | POA: Insufficient documentation

## 2019-09-02 DIAGNOSIS — Z885 Allergy status to narcotic agent status: Secondary | ICD-10-CM | POA: Diagnosis not present

## 2019-09-02 DIAGNOSIS — I10 Essential (primary) hypertension: Secondary | ICD-10-CM | POA: Diagnosis not present

## 2019-09-02 DIAGNOSIS — R928 Other abnormal and inconclusive findings on diagnostic imaging of breast: Secondary | ICD-10-CM

## 2019-09-02 DIAGNOSIS — N6489 Other specified disorders of breast: Secondary | ICD-10-CM | POA: Insufficient documentation

## 2019-09-02 HISTORY — DX: Other abnormal and inconclusive findings on diagnostic imaging of breast: R92.8

## 2019-09-02 HISTORY — PX: BREAST LUMPECTOMY WITH RADIOACTIVE SEED LOCALIZATION: SHX6424

## 2019-09-02 SURGERY — BREAST LUMPECTOMY WITH RADIOACTIVE SEED LOCALIZATION
Anesthesia: General | Site: Breast | Laterality: Right

## 2019-09-02 MED ORDER — FENTANYL CITRATE (PF) 100 MCG/2ML IJ SOLN
INTRAMUSCULAR | Status: DC | PRN
  Administered 2019-09-02: 50 ug via INTRAVENOUS

## 2019-09-02 MED ORDER — MIDAZOLAM HCL 2 MG/2ML IJ SOLN
INTRAMUSCULAR | Status: AC
  Filled 2019-09-02: qty 2

## 2019-09-02 MED ORDER — HYDROCODONE-ACETAMINOPHEN 5-325 MG PO TABS: 1.0000 | ORAL_TABLET | Freq: Four times a day (QID) | ORAL | 0 refills | Status: DC | PRN

## 2019-09-02 MED ORDER — LACTATED RINGERS IV SOLN
INTRAVENOUS | Status: DC
  Administered 2019-09-02 (×2): via INTRAVENOUS

## 2019-09-02 MED ORDER — PHENYLEPHRINE HCL (PRESSORS) 10 MG/ML IV SOLN
INTRAVENOUS | Status: DC | PRN
  Administered 2019-09-02: 80 ug via INTRAVENOUS
  Administered 2019-09-02 (×4): 40 ug via INTRAVENOUS

## 2019-09-02 MED ORDER — PROPOFOL 500 MG/50ML IV EMUL
INTRAVENOUS | Status: DC | PRN
  Administered 2019-09-02: 25 ug/kg/min via INTRAVENOUS

## 2019-09-02 MED ORDER — PROMETHAZINE HCL 25 MG/ML IJ SOLN: 6.2500 mg | INTRAMUSCULAR | Status: DC | PRN

## 2019-09-02 MED ORDER — PROPOFOL 500 MG/50ML IV EMUL
INTRAVENOUS | Status: AC
  Filled 2019-09-02: qty 100

## 2019-09-02 MED ORDER — PHENYLEPHRINE 40 MCG/ML (10ML) SYRINGE FOR IV PUSH (FOR BLOOD PRESSURE SUPPORT)
PREFILLED_SYRINGE | INTRAVENOUS | Status: AC
  Filled 2019-09-02: qty 30

## 2019-09-02 MED ORDER — CELECOXIB 200 MG PO CAPS
200.0000 mg | ORAL_CAPSULE | ORAL | Status: AC
  Administered 2019-09-02: 08:00:00 200 mg via ORAL

## 2019-09-02 MED ORDER — CELECOXIB 200 MG PO CAPS
ORAL_CAPSULE | ORAL | Status: AC
  Filled 2019-09-02: qty 1

## 2019-09-02 MED ORDER — DEXAMETHASONE SODIUM PHOSPHATE 4 MG/ML IJ SOLN
INTRAMUSCULAR | Status: DC | PRN
  Administered 2019-09-02: 4 mg via INTRAVENOUS

## 2019-09-02 MED ORDER — LIDOCAINE HCL (CARDIAC) PF 100 MG/5ML IV SOSY
PREFILLED_SYRINGE | INTRAVENOUS | Status: DC | PRN
  Administered 2019-09-02: 60 mg via INTRAVENOUS

## 2019-09-02 MED ORDER — BUPIVACAINE HCL (PF) 0.5 % IJ SOLN
INTRAMUSCULAR | Status: AC
  Filled 2019-09-02: qty 90

## 2019-09-02 MED ORDER — HYDROMORPHONE HCL 1 MG/ML IJ SOLN: 0.2500 mg | INTRAMUSCULAR | Status: DC | PRN

## 2019-09-02 MED ORDER — ACETAMINOPHEN 500 MG PO TABS
1000.0000 mg | ORAL_TABLET | ORAL | Status: AC
  Administered 2019-09-02: 1000 mg via ORAL

## 2019-09-02 MED ORDER — CEFAZOLIN SODIUM-DEXTROSE 2-4 GM/100ML-% IV SOLN
INTRAVENOUS | Status: AC
  Filled 2019-09-02: qty 100

## 2019-09-02 MED ORDER — EPHEDRINE 5 MG/ML INJ
INTRAVENOUS | Status: AC
  Filled 2019-09-02: qty 40

## 2019-09-02 MED ORDER — ACETAMINOPHEN 500 MG PO TABS
ORAL_TABLET | ORAL | Status: AC
  Filled 2019-09-02: qty 2

## 2019-09-02 MED ORDER — DEXTROSE 5 % IV SOLN
3.0000 g | INTRAVENOUS | Status: AC
  Administered 2019-09-02: 3 g via INTRAVENOUS

## 2019-09-02 MED ORDER — SODIUM CHLORIDE 0.9% FLUSH: 3.0000 mL | Freq: Two times a day (BID) | INTRAVENOUS | Status: DC

## 2019-09-02 MED ORDER — PROPOFOL 10 MG/ML IV BOLUS
INTRAVENOUS | Status: DC | PRN
  Administered 2019-09-02: 200 mg via INTRAVENOUS

## 2019-09-02 MED ORDER — GABAPENTIN 300 MG PO CAPS
300.0000 mg | ORAL_CAPSULE | ORAL | Status: AC
  Administered 2019-09-02: 08:00:00 300 mg via ORAL

## 2019-09-02 MED ORDER — MIDAZOLAM HCL 5 MG/5ML IJ SOLN
INTRAMUSCULAR | Status: DC | PRN
  Administered 2019-09-02: 2 mg via INTRAVENOUS

## 2019-09-02 MED ORDER — MEPERIDINE HCL 25 MG/ML IJ SOLN: 6.2500 mg | INTRAMUSCULAR | Status: DC | PRN

## 2019-09-02 MED ORDER — GABAPENTIN 300 MG PO CAPS
ORAL_CAPSULE | ORAL | Status: AC
  Filled 2019-09-02: qty 1

## 2019-09-02 MED ORDER — ONDANSETRON HCL 4 MG/2ML IJ SOLN
INTRAMUSCULAR | Status: DC | PRN
  Administered 2019-09-02: 4 mg via INTRAVENOUS

## 2019-09-02 MED ORDER — BUPIVACAINE HCL (PF) 0.5 % IJ SOLN
INTRAMUSCULAR | Status: DC | PRN
  Administered 2019-09-02: 20 mL

## 2019-09-02 MED ORDER — DEXAMETHASONE SODIUM PHOSPHATE 10 MG/ML IJ SOLN
INTRAMUSCULAR | Status: AC
  Filled 2019-09-02: qty 1

## 2019-09-02 MED ORDER — FENTANYL CITRATE (PF) 100 MCG/2ML IJ SOLN
INTRAMUSCULAR | Status: AC
  Filled 2019-09-02: qty 2

## 2019-09-02 MED ORDER — EPHEDRINE SULFATE 50 MG/ML IJ SOLN
INTRAMUSCULAR | Status: DC | PRN
  Administered 2019-09-02 (×2): 20 mg via INTRAVENOUS
  Administered 2019-09-02: 10 mg via INTRAVENOUS

## 2019-09-02 SURGICAL SUPPLY — 70 items
ADH SKN CLS APL DERMABOND .7 (GAUZE/BANDAGES/DRESSINGS) ×1
APL PRP STRL LF DISP 70% ISPRP (MISCELLANEOUS) ×1
APL SKNCLS STERI-STRIP NONHPOA (GAUZE/BANDAGES/DRESSINGS)
APPLIER CLIP 9.375 MED OPEN (MISCELLANEOUS) ×3
APR CLP MED 9.3 20 MLT OPN (MISCELLANEOUS) ×1
BENZOIN TINCTURE PRP APPL 2/3 (GAUZE/BANDAGES/DRESSINGS) IMPLANT
BINDER BREAST 3XL (GAUZE/BANDAGES/DRESSINGS) ×2 IMPLANT
BINDER BREAST LRG (GAUZE/BANDAGES/DRESSINGS) IMPLANT
BINDER BREAST MEDIUM (GAUZE/BANDAGES/DRESSINGS) IMPLANT
BINDER BREAST XLRG (GAUZE/BANDAGES/DRESSINGS) IMPLANT
BINDER BREAST XXLRG (GAUZE/BANDAGES/DRESSINGS) IMPLANT
BLADE HEX COATED 2.75 (ELECTRODE) ×3 IMPLANT
BLADE SURG 10 STRL SS (BLADE) IMPLANT
BLADE SURG 15 STRL LF DISP TIS (BLADE) ×1 IMPLANT
BLADE SURG 15 STRL SS (BLADE) ×3
CANISTER SUC SOCK COL 7IN (MISCELLANEOUS) IMPLANT
CANISTER SUCT 1200ML W/VALVE (MISCELLANEOUS) ×3 IMPLANT
CHLORAPREP W/TINT 26 (MISCELLANEOUS) ×3 IMPLANT
CLIP APPLIE 9.375 MED OPEN (MISCELLANEOUS) IMPLANT
CLOSURE WOUND 1/2 X4 (GAUZE/BANDAGES/DRESSINGS)
COVER BACK TABLE REUSABLE LG (DRAPES) ×3 IMPLANT
COVER MAYO STAND REUSABLE (DRAPES) ×3 IMPLANT
COVER PROBE W GEL 5X96 (DRAPES) ×3 IMPLANT
COVER WAND RF STERILE (DRAPES) IMPLANT
DECANTER SPIKE VIAL GLASS SM (MISCELLANEOUS) IMPLANT
DERMABOND ADVANCED (GAUZE/BANDAGES/DRESSINGS) ×2
DERMABOND ADVANCED .7 DNX12 (GAUZE/BANDAGES/DRESSINGS) ×1 IMPLANT
DRAPE HALF SHEET 70X43 (DRAPES) IMPLANT
DRAPE LAPAROSCOPIC ABDOMINAL (DRAPES) ×3 IMPLANT
DRAPE UTILITY XL STRL (DRAPES) ×3 IMPLANT
DRSG PAD ABDOMINAL 8X10 ST (GAUZE/BANDAGES/DRESSINGS) ×3 IMPLANT
ELECT REM PT RETURN 9FT ADLT (ELECTROSURGICAL) ×3
ELECTRODE REM PT RTRN 9FT ADLT (ELECTROSURGICAL) ×1 IMPLANT
GAUZE SPONGE 4X4 12PLY STRL LF (GAUZE/BANDAGES/DRESSINGS) ×3 IMPLANT
GLOVE BIO SURGEON STRL SZ7 (GLOVE) ×2 IMPLANT
GLOVE BIOGEL PI IND STRL 7.5 (GLOVE) IMPLANT
GLOVE BIOGEL PI INDICATOR 7.5 (GLOVE) ×2
GLOVE EXAM NITRILE MD LF STRL (GLOVE) ×2 IMPLANT
GLOVE SS BIOGEL STRL SZ 7 (GLOVE) ×1 IMPLANT
GLOVE SUPERSENSE BIOGEL SZ 7 (GLOVE) ×4
GOWN STRL REUS W/ TWL LRG LVL3 (GOWN DISPOSABLE) ×1 IMPLANT
GOWN STRL REUS W/ TWL XL LVL3 (GOWN DISPOSABLE) ×1 IMPLANT
GOWN STRL REUS W/TWL LRG LVL3 (GOWN DISPOSABLE)
GOWN STRL REUS W/TWL XL LVL3 (GOWN DISPOSABLE) ×6
ILLUMINATOR WAVEGUIDE N/F (MISCELLANEOUS) IMPLANT
KIT MARKER MARGIN INK (KITS) ×3 IMPLANT
LIGHT WAVEGUIDE WIDE FLAT (MISCELLANEOUS) IMPLANT
NDL HYPO 25X1 1.5 SAFETY (NEEDLE) ×1 IMPLANT
NEEDLE HYPO 25X1 1.5 SAFETY (NEEDLE) ×3 IMPLANT
NS IRRIG 1000ML POUR BTL (IV SOLUTION) ×3 IMPLANT
PACK BASIN DAY SURGERY FS (CUSTOM PROCEDURE TRAY) ×3 IMPLANT
PENCIL BUTTON HOLSTER BLD 10FT (ELECTRODE) ×3 IMPLANT
SLEEVE SCD COMPRESS KNEE MED (MISCELLANEOUS) ×3 IMPLANT
SPONGE LAP 18X18 RF (DISPOSABLE) IMPLANT
SPONGE LAP 4X18 RFD (DISPOSABLE) ×5 IMPLANT
STRIP CLOSURE SKIN 1/2X4 (GAUZE/BANDAGES/DRESSINGS) IMPLANT
SUT ETHILON 3 0 FSL (SUTURE) IMPLANT
SUT MNCRL AB 4-0 PS2 18 (SUTURE) ×3 IMPLANT
SUT SILK 2 0 SH (SUTURE) ×3 IMPLANT
SUT VIC AB 2-0 CT1 27 (SUTURE)
SUT VIC AB 2-0 CT1 TAPERPNT 27 (SUTURE) IMPLANT
SUT VIC AB 3-0 SH 27 (SUTURE)
SUT VIC AB 3-0 SH 27X BRD (SUTURE) IMPLANT
SUT VICRYL 3-0 CR8 SH (SUTURE) ×3 IMPLANT
SYR 10ML LL (SYRINGE) ×3 IMPLANT
TOWEL GREEN STERILE FF (TOWEL DISPOSABLE) ×3 IMPLANT
TRAY FAXITRON CT DISP (TRAY / TRAY PROCEDURE) ×3 IMPLANT
TUBE CONNECTING 20'X1/4 (TUBING) ×1
TUBE CONNECTING 20X1/4 (TUBING) ×2 IMPLANT
YANKAUER SUCT BULB TIP NO VENT (SUCTIONS) ×3 IMPLANT

## 2019-09-02 NOTE — Interval H&P Note (Signed)
History and Physical Interval Note:  09/02/2019 7:14 AM  Paula Crawford  has presented today for surgery, with the diagnosis of RIGHT BREAST COMPLEX SCLEROSING LESION.  The various methods of treatment have been discussed with the patient and family. After consideration of risks, benefits and other options for treatment, the patient has consented to  Procedure(s): RIGHT BREAST LUMPECTOMY WITH RADIOACTIVE SEED LOCALIZATION (Right) as a surgical intervention.  The patient's history has been reviewed, patient examined, no change in status, stable for surgery.  I have reviewed the patient's chart and labs.  Questions were answered to the patient's satisfaction.     Adin Hector

## 2019-09-02 NOTE — Discharge Instructions (Signed)
[place] Office Phone Number [phone]  BREAST BIOPSY/ PARTIAL MASTECTOMY: POST OP INSTRUCTIONS  Always review your discharge instruction sheet given to you by the facility where your surgery was performed.  IF YOU HAVE DISABILITY OR FAMILY LEAVE FORMS, YOU MUST BRING THEM TO THE OFFICE FOR PROCESSING.  DO NOT GIVE THEM TO YOUR DOCTOR.  1. A prescription for pain medication may be given to you upon discharge.  Take your pain medication as prescribed, if needed.  If narcotic pain medicine is not needed, then you may take acetaminophen (Tylenol) or ibuprofen (Advil) as needed.  Next dose of Tylenol or Ibuprofen can be taken at 2pm today if needed. 2. Take your usually prescribed medications unless otherwise directed 3. If you need a refill on your pain medication, please contact your pharmacy.  They will contact our office to request authorization.  Prescriptions will not be filled after 5pm or on week-ends. 4. You should eat very light the first 24 hours after surgery, such as soup, crackers, pudding, etc.  Resume your normal diet the day after surgery. 5. Most patients will experience some swelling and bruising in the breast.  Ice packs and a good support bra will help.  Swelling and bruising can take several days to resolve.  6. It is common to experience some constipation if taking pain medication after surgery.  Increasing fluid intake and taking a stool softener will usually help or prevent this problem from occurring.  A mild laxative (Milk of Magnesia or Miralax) should be taken according to package directions if there are no bowel movements after 48 hours. 7. Unless discharge instructions indicate otherwise, you may remove your bandages 24-48 hours after surgery, and you may shower at that time.  You may have steri-strips (small skin tapes) in place directly over the incision.  These strips should be left on the skin for 7-10 days.  If your surgeon used skin glue on the  incision, you may shower in 24 hours.  The glue will flake off over the next 2-3 weeks.  Any sutures or staples will be removed at the office during your follow-up visit. 8. ACTIVITIES:  You may resume regular daily activities (gradually increasing) beginning the next day.  Wearing a good support bra or sports bra minimizes pain and swelling.  You may have sexual intercourse when it is comfortable. a. You may drive when you no longer are taking prescription pain medication, you can comfortably wear a seatbelt, and you can safely maneuver your car and apply brakes. b. RETURN TO WORK:  ______________________________________________________________________________________ 9. You should see your doctor in the office for a follow-up appointment approximately two weeks after your surgery.  Your doctors nurse will typically make your follow-up appointment when she calls you with your pathology report.  Expect your pathology report 2-3 business days after your surgery.  You may call to check if you do not hear from [place] after three days. 10. OTHER INSTRUCTIONS: _______________________________________________________________________________________________ _____________________________________________________________________________________________________________________________________ _____________________________________________________________________________________________________________________________________ _____________________________________________________________________________________________________________________________________  WHEN TO CALL YOUR DOCTOR: 1. Fever over 101.0 2. Nausea and/or vomiting. 3. Extreme swelling or bruising. 4. Continued bleeding from incision. 5. Increased pain, redness, or drainage from the incision.  The clinic staff is available to answer your questions during regular business hours.  Please dont hesitate to call and ask to speak to one of the nurses for  clinical concerns.  If you have a medical emergency, go to the nearest emergency room or call 911.  A surgeon from [facility]  is always on call at the hospital.  For further questions, please visit [email or web address]    Post Anesthesia Home Care Instructions  Activity: Get plenty of rest for the remainder of the day. A responsible individual must stay with you for 24 hours following the procedure.  For the next 24 hours, DO NOT: -Drive a car -Paediatric nurse -Drink alcoholic beverages -Take any medication unless instructed by your physician -Make any legal decisions or sign important papers.  Meals: Start with liquid foods such as gelatin or soup. Progress to regular foods as tolerated. Avoid greasy, spicy, heavy foods. If nausea and/or vomiting occur, drink only clear liquids until the nausea and/or vomiting subsides. Call your physician if vomiting continues.  Special Instructions/Symptoms: Your throat may feel dry or sore from the anesthesia or the breathing tube placed in your throat during surgery. If this causes discomfort, gargle with warm salt water. The discomfort should disappear within 24 hours.  If you had a scopolamine patch placed behind your ear for the management of post- operative nausea and/or vomiting:  1. The medication in the patch is effective for 72 hours, after which it should be removed.  Wrap patch in a tissue and discard in the trash. Wash hands thoroughly with soap and water. 2. You may remove the patch earlier than 72 hours if you experience unpleasant side effects which may include dry mouth, dizziness or visual disturbances. 3. Avoid touching the patch. Wash your hands with soap and water after contact with the patch.                Managing Your Pain After Surgery Without Opioids    Thank you for participating in our program to help patients manage their pain after surgery without opioids. This is part of our  effort to provide you with the best care possible, without exposing you or your family to the risk that opioids pose.  What pain can I expect after surgery? You can expect to have some pain after surgery. This is normal. The pain is typically worse the day after surgery, and quickly begins to get better. Many studies have found that many patients are able to manage their pain after surgery with Over-the-Counter (OTC) medications such as Tylenol and Motrin. If you have a condition that does not allow you to take Tylenol or Motrin, notify your surgical team.  How will I manage my pain? The best strategy for controlling your pain after surgery is around the clock pain control with Tylenol (acetaminophen) and Motrin (ibuprofen or Advil). Alternating these medications with each other allows you to maximize your pain control. In addition to Tylenol and Motrin, you can use heating pads or ice packs on your incisions to help reduce your pain.  How will I alternate your regular strength over-the-counter pain medication? You will take a dose of pain medication every three hours. ; Start by taking 650 mg of Tylenol (2 pills of 325 mg) ; 3 hours later take 600 mg of Motrin (3 pills of 200 mg) ; 3 hours after taking the Motrin take 650 mg of Tylenol ; 3 hours after that take 600 mg of Motrin.   - 1 -  See example - if your first dose of Tylenol is at 12:00 PM   12:00 PM Tylenol 650 mg (2 pills of 325 mg)  3:00 PM Motrin 600 mg (3 pills of 200 mg)  6:00 PM Tylenol 650 mg (2 pills of 325 mg)  9:00 PM Motrin 600  mg (3 pills of 200 mg)  Continue alternating every 3 hours   We recommend that you follow this schedule around-the-clock for at least 3 days after surgery, or until you feel that it is no longer needed. Use the table on the last page of this handout to keep track of the medications you are taking. Important: Do not take more than 3000mg  of Tylenol or 3200mg  of Motrin in a 24-hour period. Do  not take ibuprofen/Motrin if you have a history of bleeding stomach ulcers, severe kidney disease, &/or actively taking a blood thinner  What if I still have pain? If you have pain that is not controlled with the over-the-counter pain medications (Tylenol and Motrin or Advil) you might have what we call breakthrough pain. You will receive a prescription for a small amount of an opioid pain medication such as Oxycodone, Tramadol, or Tylenol with Codeine. Use these opioid pills in the first 24 hours after surgery if you have breakthrough pain. Do not take more than 1 pill every 4-6 hours.  If you still have uncontrolled pain after using all opioid pills, don't hesitate to call our staff using the number provided. We will help make sure you are managing your pain in the best way possible, and if necessary, we can provide a prescription for additional pain medication.   Day 1    Time  Name of Medication Number of pills taken  Amount of Acetaminophen  Pain Level   Comments  AM PM       AM PM       AM PM       AM PM       AM PM       AM PM       AM PM       AM PM       Total Daily amount of Acetaminophen Do not take more than  3,000 mg per day      Day 2    Time  Name of Medication Number of pills taken  Amount of Acetaminophen  Pain Level   Comments  AM PM       AM PM       AM PM       AM PM       AM PM       AM PM       AM PM       AM PM       Total Daily amount of Acetaminophen Do not take more than  3,000 mg per day      Day 3    Time  Name of Medication Number of pills taken  Amount of Acetaminophen  Pain Level   Comments  AM PM       AM PM       AM PM       AM PM          AM PM       AM PM       AM PM       AM PM       Total Daily amount of Acetaminophen Do not take more than  3,000 mg per day      Day 4    Time  Name of Medication Number of pills taken  Amount of Acetaminophen  Pain Level   Comments  AM PM       AM PM  AM PM        AM PM       AM PM       AM PM       AM PM       AM PM       Total Daily amount of Acetaminophen Do not take more than  3,000 mg per day      Day 5    Time  Name of Medication Number of pills taken  Amount of Acetaminophen  Pain Level   Comments  AM PM       AM PM       AM PM       AM PM       AM PM       AM PM       AM PM       AM PM       Total Daily amount of Acetaminophen Do not take more than  3,000 mg per day       Day 6    Time  Name of Medication Number of pills taken  Amount of Acetaminophen  Pain Level  Comments  AM PM       AM PM       AM PM       AM PM       AM PM       AM PM       AM PM       AM PM       Total Daily amount of Acetaminophen Do not take more than  3,000 mg per day      Day 7    Time  Name of Medication Number of pills taken  Amount of Acetaminophen  Pain Level   Comments  AM PM       AM PM       AM PM       AM PM       AM PM       AM PM       AM PM       AM PM       Total Daily amount of Acetaminophen Do not take more than  3,000 mg per day        For additional information about how and where to safely dispose of unused opioid medications - [employer]  Disclaimer: This document contains information and/or instructional materials adapted from [place] for the typical patient with your condition. It does not replace medical advice from your health care provider because your experience may differ from that of the typical patient. Talk to your health care provider if you have any questions about this document, your condition or your treatment plan. Adapted from [place]

## 2019-09-02 NOTE — Anesthesia Postprocedure Evaluation (Signed)
Anesthesia Post Note  Patient: IT consultant  Procedure(s) Performed: RIGHT BREAST LUMPECTOMY WITH RADIOACTIVE SEED LOCALIZATION (Right Breast)     Patient location during evaluation: PACU Anesthesia Type: General Level of consciousness: sedated and patient cooperative Pain management: pain level controlled Vital Signs Assessment: post-procedure vital signs reviewed and stable Respiratory status: spontaneous breathing Cardiovascular status: stable Anesthetic complications: no    Last Vitals:  Vitals:   09/02/19 0930 09/02/19 0940  BP:  (!) 143/66  Pulse: 99 99  Resp: 20 18  Temp:  36.6 C  SpO2: 100% 100%    Last Pain:  Vitals:   09/02/19 1006  TempSrc:   PainSc: 0-No pain                 Nolon Nations

## 2019-09-02 NOTE — Transfer of Care (Signed)
Immediate Anesthesia Transfer of Care Note  Patient: Paula Crawford  Procedure(s) Performed: RIGHT BREAST LUMPECTOMY WITH RADIOACTIVE SEED LOCALIZATION (Right Breast)  Patient Location: PACU  Anesthesia Type:General  Level of Consciousness: awake, alert , oriented and patient cooperative  Airway & Oxygen Therapy: Patient Spontanous Breathing and Patient connected to face mask oxygen  Post-op Assessment: Report given to RN and Post -op Vital signs reviewed and stable  Post vital signs: Reviewed and stable  Last Vitals:  Vitals Value Taken Time  BP    Temp    Pulse 106 09/02/19 0906  Resp    SpO2 100 % 09/02/19 0906  Vitals shown include unvalidated device data.  Last Pain:  Vitals:   09/02/19 0737  TempSrc: Oral  PainSc: 0-No pain      Patients Stated Pain Goal: 3 ([id number])  Complications: No apparent anesthesia complications

## 2019-09-02 NOTE — Op Note (Signed)
Patient Name:           Paula Crawford   Date of Surgery:        09/02/2019  Pre op Diagnosis:      Complex sclerosing lesion right breast, lower outer quadrant  Post op Diagnosis:    Same  Procedure:                 Right breast lumpectomy with radioactive seed localization  Surgeon:                     Edsel Petrin. Dalbert Batman, M.D., FACS  Assistant:                      Or staff  Operative Indications:    This is a 53 year old woman, referred by Dr. Franki Cabot at [facility] and Dr. Lisbeth Ply for evaluation of complex sclerosing lesion right breast, lower outer quadrant. She has no prior history of breast problems. Recent screening mammogram show a 1 cm area of mass and distortion in the right breast, 8:30 position, 10 cm from the nipple. Left breast looks fine. Core biopsy of the right breast complex sclerosing lesion. Excision was recommended. The patient wants to have this done . There are no cancers syndromes in the family        I explained the histology of this lesion to her. I explained it is a high risk lesion occasionally 4-9% risk of early breast cancer on complete excision. She understands that most likely she does not have cancer. She would like to have this area excised to clarify her diagnosis and I agree.   Operative Findings:       The radioactive seed and marker clip were in the lower outer quadrant of the right breast.  Right breast was large.  This was about 3 cm above the inframammary crease.  The specimen mammogram looked good containing the marker clip and the seed.  There was no palpable mass  Procedure in Detail:          Following the induction of general LMA anesthesia the patient's right breast was prepped and draped in a sterile fashion.  Surgical timeout was performed.  Intravenous antibiotics were given.  0.5% Marcaine with epinephrine was used as local infiltration anesthetic.    After isolating the radioactive target in the lower outer quadrant of the  breast the  incision was planned and marked with a marking pen.  This was relatively curvilinear and transverse, parallel to the inframammary crease laterally.  The incision was made with a knife.  The lumpectomy was performed with electrocautery and the neoprobe.  The specimen was removed and marked with silk sutures and a 6 color ink kit.  Specimen mammogram looked good as described above.  The specimen was sent to the lab where the seed was retrieved.  The wound was irrigated.  Hemostasis was excellent.  I placed 5 metal marker clips in the walls of the lumpectomy cavity.  Lumpectomy cavity was closed with interrupted 3-0 Vicryl sutures and the skin closed with a running subcuticular 4-0 Monocryl and Dermabond.  Dry bandages and a breast binder were placed.  The patient tolerated the procedure well and was taken to PACU in stable condition.  EBL 10 cc.  Counts correct.  Complications none.    Addendum: I logged onto the PMP aware website and reviewed her prescription medication history     Dodi Leu M. Dalbert Batman, M.D., [place] and  Minimally Invasive Surgery Breast and Colorectal Surgery  09/02/2019 9:01 AM

## 2019-09-02 NOTE — Anesthesia Procedure Notes (Signed)
Procedure Name: LMA Insertion Date/Time: 09/02/2019 8:17 AM Performed by: Signe Colt, CRNA Pre-anesthesia Checklist: Patient identified, Emergency Drugs available, Suction available and Patient being monitored Patient Re-evaluated:Patient Re-evaluated prior to induction Oxygen Delivery Method: Circle system utilized Preoxygenation: Pre-oxygenation with 100% oxygen Induction Type: IV induction Ventilation: Mask ventilation without difficulty LMA: LMA inserted LMA Size: 4.0 Number of attempts: 1 Airway Equipment and Method: Bite block Placement Confirmation: positive ETCO2 Tube secured with: Tape Dental Injury: Teeth and Oropharynx as per pre-operative assessment

## 2019-09-03 ENCOUNTER — Encounter (HOSPITAL_BASED_OUTPATIENT_CLINIC_OR_DEPARTMENT_OTHER): Payer: Self-pay | Admitting: General Surgery

## 2019-09-03 LAB — SURGICAL PATHOLOGY

## 2019-09-03 NOTE — Progress Notes (Signed)
Inform patient of Pathology report,. Breast pathology benign. "Complex sclerosing lesion." No cancer Let me know you were able to contact her.  Paula Crawford

## 2019-10-06 ENCOUNTER — Ambulatory Visit: Payer: BC Managed Care – PPO | Admitting: Gastroenterology

## 2020-06-17 ENCOUNTER — Telehealth: Payer: Self-pay | Admitting: Gastroenterology

## 2020-06-17 ENCOUNTER — Other Ambulatory Visit: Payer: Self-pay

## 2020-06-17 DIAGNOSIS — D128 Benign neoplasm of rectum: Secondary | ICD-10-CM

## 2020-06-17 NOTE — Telephone Encounter (Signed)
Colon scheduled, pt instructed and medications reviewed.  Patient instructions mailed to home.  Patient to call with any questions or concerns.  

## 2020-08-03 ENCOUNTER — Encounter (HOSPITAL_COMMUNITY): Payer: Self-pay | Admitting: Gastroenterology

## 2020-08-03 NOTE — Progress Notes (Signed)
PCP - Dr Laural Benes Cardiologist - n/a  Chest x-ray - n/a EKG - 08/28/19 Stress Test - n/a ECHO - n/a Cardiac Cath - n/a  Aspirin Instructions: Follow your surgeon's instructions on when to stop aspirin prior to surgery,  If no instructions were given by your surgeon then you will need to call the office for those instructions.  Anesthesia review: Yes  STOP now taking any Aspirin (unless otherwise instructed by your surgeon), Aleve, Naproxen, Ibuprofen, Motrin, Advil, Goody's, BC's, all herbal medications, fish oil, and all vitamins.   Coronavirus Screening Covid test scheduled on 08/05/20 Do you have any of the following symptoms:  Cough yes/no: No Fever (>100.8F)  yes/no: No Runny nose yes/no: No Sore throat yes/no: No Difficulty breathing/shortness of breath  yes/no: No  Have you traveled in the last 14 days and where? yes/no: No  Patient verbalized understanding of instructions that were given via phone.

## 2020-08-05 ENCOUNTER — Other Ambulatory Visit (HOSPITAL_COMMUNITY)
Admission: RE | Admit: 2020-08-05 | Discharge: 2020-08-05 | Disposition: A | Discharge status: Home / Self Care | Payer: BC Managed Care – PPO | Source: Ambulatory Visit | Attending: Gastroenterology | Admitting: Gastroenterology

## 2020-08-05 DIAGNOSIS — Z01812 Encounter for preprocedural laboratory examination: Secondary | ICD-10-CM | POA: Diagnosis present

## 2020-08-05 DIAGNOSIS — Z20822 Contact with and (suspected) exposure to covid-19: Secondary | ICD-10-CM | POA: Insufficient documentation

## 2020-08-05 LAB — SARS CORONAVIRUS 2 (TAT 6-24 HRS): SARS Coronavirus 2: NEGATIVE

## 2020-08-08 ENCOUNTER — Ambulatory Visit (HOSPITAL_COMMUNITY): Payer: BC Managed Care – PPO | Admitting: Physician Assistant

## 2020-08-08 ENCOUNTER — Encounter (HOSPITAL_COMMUNITY): Payer: Self-pay | Admitting: Gastroenterology

## 2020-08-08 ENCOUNTER — Other Ambulatory Visit: Payer: Self-pay

## 2020-08-08 ENCOUNTER — Ambulatory Visit (HOSPITAL_COMMUNITY)
Admission: RE | Admit: 2020-08-08 | Discharge: 2020-08-08 | Disposition: A | Discharge status: Home / Self Care | Payer: BC Managed Care – PPO | Attending: Gastroenterology | Admitting: Gastroenterology

## 2020-08-08 ENCOUNTER — Encounter (HOSPITAL_COMMUNITY)
Admission: RE | Disposition: A | Discharge status: Home / Self Care | Payer: Self-pay | Source: Home / Self Care | Attending: Gastroenterology

## 2020-08-08 DIAGNOSIS — Z8249 Family history of ischemic heart disease and other diseases of the circulatory system: Secondary | ICD-10-CM | POA: Diagnosis not present

## 2020-08-08 DIAGNOSIS — Z8614 Personal history of Methicillin resistant Staphylococcus aureus infection: Secondary | ICD-10-CM | POA: Insufficient documentation

## 2020-08-08 DIAGNOSIS — R7303 Prediabetes: Secondary | ICD-10-CM | POA: Insufficient documentation

## 2020-08-08 DIAGNOSIS — Z1211 Encounter for screening for malignant neoplasm of colon: Secondary | ICD-10-CM | POA: Insufficient documentation

## 2020-08-08 DIAGNOSIS — Z86010 Personal history of colonic polyps: Secondary | ICD-10-CM | POA: Diagnosis not present

## 2020-08-08 DIAGNOSIS — Z885 Allergy status to narcotic agent status: Secondary | ICD-10-CM | POA: Diagnosis not present

## 2020-08-08 DIAGNOSIS — K5730 Diverticulosis of large intestine without perforation or abscess without bleeding: Secondary | ICD-10-CM | POA: Diagnosis not present

## 2020-08-08 DIAGNOSIS — I10 Essential (primary) hypertension: Secondary | ICD-10-CM | POA: Insufficient documentation

## 2020-08-08 DIAGNOSIS — K642 Third degree hemorrhoids: Secondary | ICD-10-CM | POA: Diagnosis not present

## 2020-08-08 DIAGNOSIS — K635 Polyp of colon: Secondary | ICD-10-CM | POA: Diagnosis not present

## 2020-08-08 DIAGNOSIS — F419 Anxiety disorder, unspecified: Secondary | ICD-10-CM | POA: Diagnosis not present

## 2020-08-08 DIAGNOSIS — Z90710 Acquired absence of both cervix and uterus: Secondary | ICD-10-CM | POA: Insufficient documentation

## 2020-08-08 DIAGNOSIS — K644 Residual hemorrhoidal skin tags: Secondary | ICD-10-CM | POA: Insufficient documentation

## 2020-08-08 DIAGNOSIS — Z6841 Body Mass Index (BMI) 40.0 and over, adult: Secondary | ICD-10-CM | POA: Insufficient documentation

## 2020-08-08 DIAGNOSIS — D128 Benign neoplasm of rectum: Secondary | ICD-10-CM

## 2020-08-08 HISTORY — PX: SUBMUCOSAL TATTOO INJECTION: SHX6856

## 2020-08-08 HISTORY — PX: COLONOSCOPY WITH PROPOFOL: SHX5780

## 2020-08-08 HISTORY — PX: POLYPECTOMY: SHX5525

## 2020-08-08 HISTORY — PX: SUBMUCOSAL LIFTING INJECTION: SHX6855

## 2020-08-08 HISTORY — PX: BIOPSY: SHX5522

## 2020-08-08 SURGERY — COLONOSCOPY WITH PROPOFOL
Anesthesia: Monitor Anesthesia Care

## 2020-08-08 MED ORDER — SPOT INK MARKER SYRINGE KIT
PACK | SUBMUCOSAL | Status: DC | PRN
  Administered 2020-08-08: 1 mL via SUBMUCOSAL

## 2020-08-08 MED ORDER — PROPOFOL 10 MG/ML IV BOLUS
INTRAVENOUS | Status: DC | PRN
  Administered 2020-08-08: 70 mg via INTRAVENOUS

## 2020-08-08 MED ORDER — SPOT INK MARKER SYRINGE KIT
PACK | SUBMUCOSAL | Status: AC
  Filled 2020-08-08: qty 5

## 2020-08-08 MED ORDER — SODIUM CHLORIDE 0.9 % IV SOLN: INTRAVENOUS | Status: DC

## 2020-08-08 MED ORDER — PROPOFOL 500 MG/50ML IV EMUL
INTRAVENOUS | Status: DC | PRN
  Administered 2020-08-08: 100 ug/kg/min via INTRAVENOUS

## 2020-08-08 MED ORDER — LACTATED RINGERS IV SOLN: INTRAVENOUS | Status: DC | PRN

## 2020-08-08 MED ORDER — LIDOCAINE HCL URETHRAL/MUCOSAL 2 % EX GEL
CUTANEOUS | Status: AC
  Filled 2020-08-08: qty 11

## 2020-08-08 MED ORDER — PHENYLEPHRINE 40 MCG/ML (10ML) SYRINGE FOR IV PUSH (FOR BLOOD PRESSURE SUPPORT)
PREFILLED_SYRINGE | INTRAVENOUS | Status: DC | PRN
  Administered 2020-08-08: 80 ug via INTRAVENOUS
  Administered 2020-08-08 (×2): 120 ug via INTRAVENOUS
  Administered 2020-08-08: 160 ug via INTRAVENOUS

## 2020-08-08 MED ORDER — LIDOCAINE HCL URETHRAL/MUCOSAL 2 % EX GEL
CUTANEOUS | Status: DC | PRN
  Administered 2020-08-08: 1

## 2020-08-08 SURGICAL SUPPLY — 22 items

## 2020-08-08 NOTE — H&P (Signed)
GASTROENTEROLOGY PROCEDURE H&P NOTE   Primary Care Physician: Geannie Risen, MD  HPI: Paula Crawford is a 54 y.o. female who presents for Colonoscopy for follow up of large rectal TVA s/p EMR in 2020.  Past Medical History:  Diagnosis Date  . Abnormality of right breast on screening mammogram 09/02/2019  . Anemia   . Anxiety   . Blood in stool   . 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 Hypertension   . Insomnia   . MRSA infection 2018   stomach area - results not in EPIC  . Obese   . Prediabetes    Past Surgical History:  Procedure Laterality Date  . ABDOMINAL HYSTERECTOMY N/A 07/08/2013   Procedure: HYSTERECTOMY ABDOMINAL;  Surgeon: Eldred Manges, MD;  Location: [facility];  Service: Gynecology;  Laterality: N/A;  . BREAST LUMPECTOMY WITH RADIOACTIVE SEED LOCALIZATION Right 09/02/2019   Procedure: RIGHT BREAST LUMPECTOMY WITH RADIOACTIVE SEED LOCALIZATION;  Surgeon: Fanny Skates, MD;  Location: [place];  Service: General;  Laterality: Right;  . CESAREAN SECTION    . COLONOSCOPY    . ENDOSCOPIC MUCOSAL RESECTION N/A 08/03/2019   Procedure: ENDOSCOPIC MUCOSAL RESECTION;  Surgeon: Rush Landmark Telford Nab., MD;  Location: [place];  Service: Gastroenterology;  Laterality: N/A;  . FLEXIBLE SIGMOIDOSCOPY N/A 08/03/2019   Procedure: FLEXIBLE SIGMOIDOSCOPY;  Surgeon: Irving Copas., MD;  Location: [place];  Service: Gastroenterology;  Laterality: N/A;  . HEMOSTASIS CLIP PLACEMENT  08/03/2019   Procedure: HEMOSTASIS CLIP PLACEMENT;  Surgeon: Irving Copas., MD;  Location: [place];  Service: Gastroenterology;;  . Clide Deutscher  08/03/2019   Procedure: Clide Deutscher;  Surgeon: Irving Copas., MD;  Location: [place];  Service: Gastroenterology;;  . Lia Foyer LIFTING INJECTION  08/03/2019   Procedure: SUBMUCOSAL LIFTING INJECTION;  Surgeon: Irving Copas., MD;  Location: [place];  Service: Gastroenterology;;  . TOTAL ABDOMINAL HYSTERECTOMY  2014   Current Facility-Administered Medications  Medication Dose Route Frequency Provider Last Rate Last Admin  . 0.9 %  sodium chloride infusion   Intravenous Continuous Mansouraty, Telford Nab., MD       Allergies  Allergen Reactions  . Morphine And Related Itching   Family History  Problem Relation Age of Onset  . Heart disease Mother   . Heart murmur Mother   . Healthy Son   . Colon cancer Neg Hx   . Esophageal cancer Neg Hx   . Inflammatory bowel disease Neg Hx   . Liver disease Neg Hx   . Pancreatic cancer Neg Hx   . Rectal cancer Neg Hx   . Stomach cancer Neg Hx    Social History   Socioeconomic History  . Marital status: Married    Spouse name: Not on file  . Number of children: Not on file  . Years of education: Not on file  . Highest education level: Not on file  Occupational History  . Not on file  Tobacco Use  . Smoking status: Never Smoker  . Smokeless tobacco: Never Used  Vaping Use  . Vaping Use: Never used  Substance and Sexual Activity  . Alcohol use: Not Currently    Comment: occ   . Drug use: No  . Sexual activity: Not on file    Comment: Hysterectomy  Other Topics Concern  . Not on file  Social History Narrative  . Not on file   Social Determinants of Health   Financial Resource Strain:   .  Difficulty of Paying Living Expenses: Not on file  Food Insecurity:   . Worried About Charity fundraiser in the Last Year: Not on file  . Ran Out of Food in the Last Year: Not on file  Transportation Needs:   . Lack of Transportation (Medical): Not on file  . Lack of Transportation (Non-Medical): Not on file  Physical Activity:   . Days of Exercise per Week: Not on file  . Minutes of Exercise per Session: Not on file  Stress:   . Feeling of Stress : Not on file  Social Connections:   . Frequency of Communication with Friends and Family: Not on file  . Frequency of Social  Gatherings with Friends and Family: Not on file  . Attends Religious Services: Not on file  . Active Member of Clubs or Organizations: Not on file  . Attends Archivist Meetings: Not on file  . Marital Status: Not on file  Intimate Partner Violence:   . Fear of Current or Ex-Partner: Not on file  . Emotionally Abused: Not on file  . Physically Abused: Not on file  . Sexually Abused: Not on file    Physical Exam: Vital signs in last 24 hours: Temp:  [98.6 F (37 C)] 98.6 F (37 C) (10/04 1200) Pulse Rate:  [102] 102 (10/04 1200) Resp:  [18] 18 (10/04 1200) BP: (155)/(72) 155/72 (10/04 1200) SpO2:  [99 %] 99 % (10/04 1200) Weight:  [113.9 kg] 113.9 kg (10/04 1200)   GEN: NAD EYE: Sclerae anicteric ENT: MMM CV: Non-tachycardic GI: Soft, NT/ND NEURO:  Alert & Oriented x 3  Lab Results: No results for input(s): WBC, HGB, HCT, PLT in the last 72 hours. BMET No results for input(s): NA, K, CL, CO2, GLUCOSE, BUN, CREATININE, CALCIUM in the last 72 hours. LFT No results for input(s): PROT, ALBUMIN, AST, ALT, ALKPHOS, BILITOT, BILIDIR, IBILI in the last 72 hours. PT/INR No results for input(s): LABPROT, INR in the last 72 hours.   Impression / Plan: This is a 54 y.o.female who presents for Colonoscopy for follow up of large rectal TVA s/p EMR in 2020.  The risks and benefits of endoscopic evaluation were discussed with the patient; these include but are not limited to the risk of perforation, infection, bleeding, missed lesions, lack of diagnosis, severe illness requiring hospitalization, as well as anesthesia and sedation related illnesses.  The patient is agreeable to proceed.    Justice Britain, MD [facility] Office # [phone]

## 2020-08-08 NOTE — Anesthesia Postprocedure Evaluation (Signed)
Anesthesia Post Note  Patient: Paula Crawford  Procedure(s) Performed: COLONOSCOPY WITH PROPOFOL (N/A ) POLYPECTOMY BIOPSY SUBMUCOSAL TATTOO INJECTION SUBMUCOSAL LIFTING INJECTION     Patient location during evaluation: PACU Anesthesia Type: MAC Level of consciousness: awake and alert Pain management: pain level controlled Vital Signs Assessment: post-procedure vital signs reviewed and stable Respiratory status: spontaneous breathing, nonlabored ventilation and respiratory function stable Cardiovascular status: stable and blood pressure returned to baseline Anesthetic complications: no   No complications documented.  Last Vitals:  Vitals:   08/08/20 1425 08/08/20 1435  BP: 123/66 125/69  Pulse: 92 91  Resp: (!) 28 (!) 23  Temp:    SpO2: 100% 100%    Last Pain:  Vitals:   08/08/20 1435  TempSrc:   PainSc: 0-No pain                 Audry Pili

## 2020-08-08 NOTE — Anesthesia Preprocedure Evaluation (Addendum)
Anesthesia Evaluation  Patient identified by MRN, date of birth, ID band Patient awake    Reviewed: Allergy & Precautions, NPO status , Patient's Chart, lab work & pertinent test results  History of Anesthesia Complications Negative for: history of anesthetic complications  Airway Mallampati: II  TM Distance: >3 FB Neck ROM: Full    Dental  (+) Dental Advisory Given, Teeth Intact   Pulmonary neg pulmonary ROS,    Pulmonary exam normal        Cardiovascular hypertension, Pt. on medications Normal cardiovascular exam     Neuro/Psych PSYCHIATRIC DISORDERS Anxiety negative neurological ROS     GI/Hepatic negative GI ROS, Neg liver ROS,   Endo/Other  Morbid obesity Pre-DM   Renal/GU negative Renal ROS     Musculoskeletal negative musculoskeletal ROS (+)   Abdominal   Peds  Hematology   Anesthesia Other Findings Covid test negative   Reproductive/Obstetrics                            Anesthesia Physical Anesthesia Plan  ASA: III  Anesthesia Plan: MAC   Post-op Pain Management:    Induction: Intravenous  PONV Risk Score and Plan: 2 and Propofol infusion and Treatment may vary due to age or medical condition  Airway Management Planned: Natural Airway and Simple Face Mask  Additional Equipment: None  Intra-op Plan:   Post-operative Plan:   Informed Consent: I have reviewed the patients History and Physical, chart, labs and discussed the procedure including the risks, benefits and alternatives for the proposed anesthesia with the patient or authorized representative who has indicated his/her understanding and acceptance.       Plan Discussed with: CRNA and Anesthesiologist  Anesthesia Plan Comments:        Anesthesia Quick Evaluation

## 2020-08-08 NOTE — Op Note (Signed)
[facility] Patient Name: Paula Crawford Procedure Date : 08/08/2020 MRN: [id number] Attending MD: Justice Britain , MD Date of Birth: 05/03/1966 CSN: [id number] Age: 54 Admit Type: Outpatient Procedure:                Colonoscopy Indications:              Surveillance: Personal history of piecemeal removal                            of adenoma on last colonoscopy (less than 1 year                            ago) Providers:                Justice Britain, MD, Burtis Junes, RN, Elspeth Cho Tech., Technician, Lance Coon, CRNA Referring MD:             Estill Cotta. Loletha Carrow, MD, Geannie Risen Medicines:                Monitored Anesthesia Care Complications:            No immediate complications. Estimated Blood Loss:     Estimated blood loss was minimal. Procedure:                Pre-Anesthesia Assessment:                           - Prior to the procedure, a History and Physical                            was performed, and patient medications and                            allergies were reviewed. The patient's tolerance of                            previous anesthesia was also reviewed. The risks                            and benefits of the procedure and the sedation                            options and risks were discussed with the patient.                            All questions were answered, and informed consent                            was obtained. Prior Anticoagulants: The patient has                            taken no previous anticoagulant or antiplatelet  agents except for aspirin. ASA Grade Assessment:                            III - A patient with severe systemic disease. After                            reviewing the risks and benefits, the patient was                            deemed in satisfactory condition to undergo the                            procedure.                            After obtaining informed consent, the colonoscope                            was passed under direct vision. Throughout the                            procedure, the patient's blood pressure, pulse, and                            oxygen saturations were monitored continuously. The                            CF-HQ190L ([id number]) Olympus colonoscope was                            introduced through the anus and advanced to the 5                            cm into the ileum. The colonoscopy was performed                            without difficulty. The patient tolerated the                            procedure. The quality of the bowel preparation was                            adequate. The terminal ileum, ileocecal valve,                            appendiceal orifice, and rectum were photographed. Scope In: 1:25:57 PM Scope Out: 2:08:37 PM Scope Withdrawal Time: 0 hours 37 minutes 39 seconds  Total Procedure Duration: 0 hours 42 minutes 40 seconds  Findings:      Skin tags were found on perianal exam.      The digital rectal exam findings include hemorrhoids. Pertinent       negatives include no palpable rectal lesions.      The terminal ileum and ileocecal valve appeared normal.      A few small-mouthed diverticula were  found in the sigmoid colon.      Five sessile polyps were found in the recto-sigmoid colon. The polyps       were 1 to 3 mm in size. These polyps were removed with a cold snare.       Resection and retrieval were complete. Likely hyperplastic polyps.      A large post mucosectomy scar was found in the distal rectum. There was       a small amount of possible residual polypoid tissue vs clip artifact.       This tissue and the scar were sampled/removed as a resection with a cold       snare. Resection and retrieval were complete. Area lateral to the scar       site was tattooed with an injection of Spot (carbon black).      Normal mucosa was found in the entire colon  otherwise.      Non-bleeding non-thrombosed external and internal hemorrhoids were found       during retroflexion, during perianal exam and during digital exam. The       hemorrhoids were Grade III (internal hemorrhoids that prolapse but       require manual reduction). Impression:               - Perianal skin tags found on perianal exam.                           - Hemorrhoids found on digital rectal exam.                           - The examined portion of the ileum was normal.                           - Diverticulosis in the sigmoid colon.                           - Five 1 to 3 mm polyps at the recto-sigmoid colon,                            removed with a cold snare - likely hyperplastic.                            Resected and retrieved.                           - Post mucosectomy scar in the distal rectum with                            very small ?recurrent/residual polypoid tissue.                            Resected the scar and tissue. Tattooed lateral edge.                           - Normal mucosa in the entire examined colon                            otherwise.                           -  Non-bleeding non-thrombosed external and internal                            hemorrhoids. Recommendation:           - The patient will be observed post-procedure,                            until all discharge criteria are met.                           - Discharge patient to home.                           - Patient has a contact number available for                            emergencies. The signs and symptoms of potential                            delayed complications were discussed with the                            patient. Return to normal activities tomorrow.                            Written discharge instructions were provided to the                            patient.                           - High fiber diet.                           - Patient may have slight  oozing/bleeding for the                            next 24-48 hours.                           - Colace 200 mg BID for next week to ensure stools                            are soft.                           - Miralax BID PRN to ensure bowel movements daily                            and no straining.                           - Preparation H cream or ointment to the perineum                            and  potentially to the first digit of knuckle                            should be used to help decrease inflammation from                            adjacent hemorrhoids.                           - Minimize NSAID use x 2-weeks.                           - May restart Aspirin in 48 hours.                           - Await pathology results.                           - Repeat colonoscopy will be based on pathology. If                            no evidence of adenomatous tissue then would                            recommend 3-year follow up, if any small amount of                            adenomatous tissue then repeat colonoscopy in                            1-year.                           - The findings and recommendations were discussed                            with the patient.                           - The findings and recommendations were discussed                            with the patient's family. Procedure Code(s):        --- Professional ---                           [phone], Colonoscopy, flexible; with removal of                            tumor(s), polyp(s), or other lesion(s) by snare                            technique Diagnosis Code(s):        --- Professional ---  K64.2, Third degree hemorrhoids                           K63.5, Polyp of colon                           Z98.890, Other specified postprocedural states                           K64.4, Residual hemorrhoidal skin tags                           Z09, Encounter for follow-up  examination after                            completed treatment for conditions other than                            malignant neoplasm                           Z86.010, Personal history of colonic polyps                           K57.30, Diverticulosis of large intestine without                            perforation or abscess without bleeding CPT copyright 2019 American Medical Association. All rights reserved. The codes documented in this report are preliminary and upon coder review may  be revised to meet current compliance requirements. Justice Britain, MD 08/08/2020 2:39:07 PM Number of Addenda: 0

## 2020-08-08 NOTE — Transfer of Care (Signed)
Immediate Anesthesia Transfer of Care Note  Patient: Paula Crawford  Procedure(s) Performed: COLONOSCOPY WITH PROPOFOL (N/A ) POLYPECTOMY BIOPSY SUBMUCOSAL TATTOO INJECTION SUBMUCOSAL LIFTING INJECTION  Patient Location: Endoscopy Unit  Anesthesia Type:MAC  Level of Consciousness: drowsy and patient cooperative  Airway & Oxygen Therapy: Patient Spontanous Breathing  Post-op Assessment: Report given to RN and Post -op Vital signs reviewed and stable  Post vital signs: Reviewed and stable  Last Vitals:  Vitals Value Taken Time  BP 116/65 08/08/20 1415  Temp    Pulse 100 08/08/20 1416  Resp 38 08/08/20 1416  SpO2 100 % 08/08/20 1416  Vitals shown include unvalidated device data.  Last Pain:  Vitals:   08/08/20 1200  TempSrc: Oral  PainSc: 0-No pain         Complications: No complications documented.

## 2020-08-08 NOTE — Discharge Instructions (Signed)
Colonoscopy, Adult, Care After This sheet gives you information about how to care for yourself after your procedure. Your doctor may also give you more specific instructions. If you have problems or questions, call your doctor. What can I expect after the procedure? After the procedure, it is common to have:  A small amount of blood in your poop (stool) for 24 hours.  Some gas.  Mild cramping or bloating in your belly (abdomen). Follow these instructions at home: Eating and drinking   Drink enough fluid to keep your pee (urine) pale yellow.  Follow instructions from your doctor about what you cannot eat or drink.  Return to your normal diet as told by your doctor. Avoid heavy or fried foods that are hard to digest. Activity  Rest as told by your doctor.  Do not sit for a long time without moving. Get up to take short walks every 1-2 hours. This is important. Ask for help if you feel weak or unsteady.  Return to your normal activities as told by your doctor. Ask your doctor what activities are safe for you. To help cramping and bloating:   Try walking around.  Put heat on your belly as told by your doctor. Use the heat source that your doctor recommends, such as a moist heat pack or a heating pad. ? Put a towel between your skin and the heat source. ? Leave the heat on for 20-30 minutes. ? Remove the heat if your skin turns bright red. This is very important if you are unable to feel pain, heat, or cold. You may have a greater risk of getting burned. General instructions  For the first 24 hours after the procedure: ? Do not drive or use machinery. ? Do not sign important documents. ? Do not drink alcohol. ? Do your daily activities more slowly than normal. ? Eat foods that are soft and easy to digest.  Take over-the-counter or prescription medicines only as told by your doctor.  Keep all follow-up visits as told by your doctor. This is important. Contact a doctor  if:  You have blood in your poop 2-3 days after the procedure. Get help right away if:  You have more than a small amount of blood in your poop.  You see large clumps of tissue (blood clots) in your poop.  Your belly is swollen.  You feel like you may vomit (nauseous).  You vomit.  You have a fever.  You have belly pain that gets worse, and medicine does not help your pain. Summary  After the procedure, it is common to have a small amount of blood in your poop. You may also have mild cramping and bloating in your belly.  For the first 24 hours after the procedure, do not drive or use machinery, do not sign important documents, and do not drink alcohol.  Get help right away if you have a lot of blood in your poop, feel like you may vomit, have a fever, or have more belly pain. This information is not intended to replace advice given to you by your health care provider. Make sure you discuss any questions you have with your health care provider. Document Revised: 05/18/2019 Document Reviewed: 05/18/2019 Elsevier Patient Education  2020 [employer]  

## 2020-08-09 ENCOUNTER — Telehealth: Payer: Self-pay | Admitting: Gastroenterology

## 2020-08-09 LAB — SURGICAL PATHOLOGY

## 2020-08-09 NOTE — Telephone Encounter (Signed)
The pt has pain in the rectum since colon.  She does have hemorrhoids mentioned on the report.  She says the anus is itchy and painful.  She is going to get Prep H or recti care OTC and if that does not help she will call back.  She has no pain otherwise.  She is going to update if pain persists OTC prep H or recticare.

## 2020-08-10 ENCOUNTER — Encounter (HOSPITAL_COMMUNITY): Payer: Self-pay | Admitting: Gastroenterology

## 2020-08-11 ENCOUNTER — Encounter: Payer: Self-pay | Admitting: Gastroenterology

## 2021-01-25 ENCOUNTER — Other Ambulatory Visit: Payer: Self-pay | Admitting: Obstetrics and Gynecology

## 2021-01-25 DIAGNOSIS — E210 Primary hyperparathyroidism: Secondary | ICD-10-CM

## 2021-02-09 ENCOUNTER — Ambulatory Visit
Admission: RE | Admit: 2021-02-09 | Discharge: 2021-02-09 | Disposition: A | Discharge status: Home / Self Care | Payer: BC Managed Care – PPO | Source: Ambulatory Visit | Attending: Obstetrics and Gynecology | Admitting: Obstetrics and Gynecology

## 2021-02-09 DIAGNOSIS — E210 Primary hyperparathyroidism: Secondary | ICD-10-CM

## 2021-02-10 ENCOUNTER — Other Ambulatory Visit: Payer: Self-pay | Admitting: Obstetrics and Gynecology

## 2021-02-10 DIAGNOSIS — E210 Primary hyperparathyroidism: Secondary | ICD-10-CM

## 2021-02-27 ENCOUNTER — Other Ambulatory Visit: Payer: BC Managed Care – PPO

## 2021-03-03 ENCOUNTER — Ambulatory Visit
Admission: RE | Admit: 2021-03-03 | Discharge: 2021-03-03 | Disposition: A | Discharge status: Home / Self Care | Payer: BC Managed Care – PPO | Source: Ambulatory Visit | Attending: Obstetrics and Gynecology | Admitting: Obstetrics and Gynecology

## 2021-03-03 DIAGNOSIS — E210 Primary hyperparathyroidism: Secondary | ICD-10-CM

## 2021-03-03 MED ORDER — IOPAMIDOL (ISOVUE-370) INJECTION 76%
75.0000 mL | Freq: Once | INTRAVENOUS | Status: AC | PRN
  Administered 2021-03-03: 75 mL via INTRAVENOUS

## 2021-07-13 ENCOUNTER — Other Ambulatory Visit: Payer: Self-pay

## 2021-07-13 ENCOUNTER — Encounter: Payer: Self-pay | Admitting: Endocrinology

## 2021-07-13 ENCOUNTER — Ambulatory Visit (INDEPENDENT_AMBULATORY_CARE_PROVIDER_SITE_OTHER): Payer: BC Managed Care – PPO | Admitting: Endocrinology

## 2021-07-13 DIAGNOSIS — R000 Tachycardia, unspecified: Secondary | ICD-10-CM | POA: Diagnosis not present

## 2021-07-13 LAB — BASIC METABOLIC PANEL
BUN: 9 mg/dL (ref 6–23)
CO2: 32 mEq/L (ref 19–32)
Calcium: 10.8 mg/dL — ABNORMAL HIGH (ref 8.4–10.5)
Chloride: 100 mEq/L (ref 96–112)
Creatinine, Ser: 0.74 mg/dL (ref 0.40–1.20)
GFR: 91.41 mL/min (ref >60.00)
Glucose, Bld: 110 mg/dL — ABNORMAL HIGH (ref 70–99)
Potassium: 3.2 mEq/L — ABNORMAL LOW (ref 3.5–5.1)
Sodium: 139 mEq/L (ref 135–145)

## 2021-07-13 LAB — TSH: TSH: 1.58 u[IU]/mL (ref 0.35–5.50)

## 2021-07-13 LAB — T4, FREE: Free T4: 0.8 ng/dL (ref 0.60–1.60)

## 2021-07-13 MED ORDER — LOSARTAN POTASSIUM-HCTZ 100-12.5 MG PO TABS: 1.0000 | ORAL_TABLET | Freq: Every day | ORAL | 3 refills | Status: DC

## 2021-07-13 NOTE — Progress Notes (Signed)
Subjective:    Patient ID: Paula Crawford, female    DOB: Dec 28, 1965, 55 y.o.   MRN: [id number]  HPI Pt is referred by Dr Laural Benes, for hypercalcemia.  Pt was noted to have hypercalcemia in 2022.  she has never had osteoporosis, urolithiasis, thyroid probs, sarcoidosis, cancer, PUD, pancreatitis, depression, or bony fracture.  He does not take vitamin-D, 2000 units/d.  She does not take A supplement.  Pt denies taking Li++  Only bony fx was ankle (2011--traumatic).   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   Hypertension    Insomnia    MRSA infection 2018   stomach area - results not in EPIC   Obese    Prediabetes     Past Surgical History:  Procedure Laterality Date   ABDOMINAL HYSTERECTOMY N/A 07/08/2013   Procedure: HYSTERECTOMY ABDOMINAL;  Surgeon: Eldred Manges, MD;  Location: [facility];  Service: Gynecology;  Laterality: N/A;   BIOPSY  08/08/2020   Procedure: BIOPSY;  Surgeon: Rush Landmark Telford Nab., MD;  Location: [place];  Service: Gastroenterology;;   BREAST LUMPECTOMY WITH RADIOACTIVE SEED LOCALIZATION Right 09/02/2019   Procedure: RIGHT BREAST LUMPECTOMY WITH RADIOACTIVE SEED LOCALIZATION;  Surgeon: Fanny Skates, MD;  Location: [place];  Service: General;  Laterality: Right;   CESAREAN SECTION     COLONOSCOPY     COLONOSCOPY WITH PROPOFOL N/A 08/08/2020   Procedure: COLONOSCOPY WITH PROPOFOL;  Surgeon: Irving Copas., MD;  Location: [place];  Service: Gastroenterology;  Laterality: N/A;   ENDOSCOPIC MUCOSAL RESECTION N/A 08/03/2019   Procedure: ENDOSCOPIC MUCOSAL RESECTION;  Surgeon: Rush Landmark Telford Nab., MD;  Location: [place];  Service: Gastroenterology;  Laterality: N/A;   FLEXIBLE SIGMOIDOSCOPY N/A 08/03/2019   Procedure: FLEXIBLE SIGMOIDOSCOPY;   Surgeon: Rush Landmark Telford Nab., MD;  Location: [place];  Service: Gastroenterology;  Laterality: N/A;   HEMOSTASIS CLIP PLACEMENT  08/03/2019   Procedure: HEMOSTASIS CLIP PLACEMENT;  Surgeon: Irving Copas., MD;  Location: [place];  Service: Gastroenterology;;   POLYPECTOMY  08/08/2020   Procedure: POLYPECTOMY;  Surgeon: Irving Copas., MD;  Location: [place];  Service: Gastroenterology;;   Clide Deutscher  08/03/2019   Procedure: Clide Deutscher;  Surgeon: Mansouraty, Telford Nab., MD;  Location: [place];  Service: Gastroenterology;;   SUBMUCOSAL LIFTING INJECTION  08/03/2019   Procedure: SUBMUCOSAL LIFTING INJECTION;  Surgeon: Irving Copas., MD;  Location: [place];  Service: Gastroenterology;;   [place] INJECTION  08/08/2020   Procedure: SUBMUCOSAL LIFTING INJECTION;  Surgeon: Irving Copas., MD;  Location: [place];  Service: Gastroenterology;;   SUBMUCOSAL TATTOO INJECTION  08/08/2020   Procedure: SUBMUCOSAL TATTOO INJECTION;  Surgeon: Irving Copas., MD;  Location: [place];  Service: Gastroenterology;;   TOTAL ABDOMINAL HYSTERECTOMY  2014    Social History   Socioeconomic History   Marital status: Married    Spouse name: Not on file   Number of children: Not on file   Years of education: Not on file   Highest education level: Not on file  Occupational History   Not on file  Tobacco Use   Smoking status: Never   Smokeless tobacco: Never  Vaping Use   Vaping Use: Never used  Substance and Sexual Activity   Alcohol use: Not Currently    Comment: occ  Drug use: No   Sexual activity: Not on file    Comment: Hysterectomy  Other Topics Concern   Not on file  Social History Narrative   Not on file   Social Determinants of Health   Financial Resource Strain: Not on file  Food Insecurity: Not on file  Transportation Needs: Not on file  Physical Activity: Not on file  Stress: Not on file   Social Connections: Not on file  Intimate Partner Violence: Not on file    Current Outpatient Medications on File Prior to Visit  Medication Sig Dispense Refill   aspirin EC 81 MG tablet Take 81 mg by mouth daily.     aspirin-acetaminophen-caffeine (EXCEDRIN MIGRAINE) 250-250-65 MG tablet Take 2 tablets by mouth every 6 (six) hours as needed for headache.     Biotin w/ Vitamins C & E (HAIR/SKIN/NAILS PO) Take 1 tablet by mouth every evening.     gabapentin (NEURONTIN) 300 MG capsule Take 600 mg by mouth at bedtime.     ibuprofen (ADVIL) 200 MG tablet Take 600 mg by mouth every 6 (six) hours as needed for headache or moderate pain.     metFORMIN (GLUCOPHAGE-XR) 500 MG 24 hr tablet Take 500 mg by mouth daily.     Potassium 99 MG TABS Take 99 mg by mouth at bedtime.     doxycycline (VIBRAMYCIN) 100 MG capsule Take 100 mg by mouth 2 (two) times daily.     HYDROcodone-acetaminophen (NORCO) 5-325 MG tablet Take 1-2 tablets by mouth every 6 (six) hours as needed for moderate pain or severe pain. 20 tablet 0   penicillin v potassium (VEETID) 500 MG tablet Take 500 mg by mouth 4 (four) times daily.     No current facility-administered medications on file prior to visit.    Allergies  Allergen Reactions   Morphine And Related Itching    Family History  Problem Relation Age of Onset   Heart disease Mother    Heart murmur Mother    Healthy Son    Colon cancer Neg Hx    Esophageal cancer Neg Hx    Inflammatory bowel disease Neg Hx    Liver disease Neg Hx    Pancreatic cancer Neg Hx    Rectal cancer Neg Hx    Stomach cancer Neg Hx    Hyperparathyroidism Neg Hx     BP 116/78   Pulse (!) 112   Ht '5\' 1"'$  (1.549 m)   Wt 254 lb (115.2 kg)   LMP 06/30/2013 (LMP Unknown)   SpO2 97%   BMI 47.99 kg/m   Review of Systems Denies weight loss, polyuria, hematuria, numbness.  No change in chronic back pain.      Objective:   Physical Exam VS: see vs page GEN: no distress HEAD: head: no  deformity eyes: no periorbital swelling, no proptosis external nose and ears are normal NECK: supple, thyroid is not enlarged CHEST WALL: no deformity LUNGS: clear to auscultation CV: reg rate and rhythm, no murmur.  MUSCULOSKELETAL: gait is normal and steady EXTEMITIES: no deformity.  Trace bilat leg edema NEURO:  readily moves all 4's.  sensation is intact to touch on all 4's SKIN:  Normal texture and temperature.  No rash or suspicious lesion is visible.   NODES:  None palpable at the neck PSYCH: alert, well-oriented.  Does not appear anxious nor depressed.       Lab Results  Component Value Date   CALCIUM 10.1 08/28/2019   [place] (2022): 3.4  x 0.9 x 1.1 cm hypoechoic mass located posterior and inferior to the inferior tip of the left thyroid lobe is suspicious for parathyroid lesion.  CT (2022): Thyroid gland appears normal. Immediately inferior and posterior to the left lobe of the thyroid is a 2.5 X 1 x 1.5 cm enhancing mass lesion most consistent with parathyroid adenoma.  25-Oh vit-D=44 Ca++=10.8 PTH=49  I have reviewed outside records, and summarized: Pt was noted to have elevated Ca++, and referred here.  HTN was well-controlled, on Hyzaar  Lab Results  Component Value Date   PTH 90 (H) 07/13/2021   CALCIUM 10.8 (H) 07/13/2021   CALCIUM 10.8 (H) 07/13/2021       Assessment & Plan:  Hyperparathyroidism, prob primary.   HTN: well-controlled.  We'll reduce HCTZ first, to see if that normalizes Ca++.   Patient Instructions  Blood tests are requested for you today.  We'll let you know about the results.   I have sent a prescription to your pharmacy, to reduce the losartan-HCTZ.   Please come back for a follow-up appointment in 1 month.

## 2021-07-13 NOTE — Patient Instructions (Addendum)
Blood tests are requested for you today.  We'll let you know about the results.   I have sent a prescription to your pharmacy, to reduce the losartan-HCTZ.   Please come back for a follow-up appointment in 1 month.

## 2021-07-14 LAB — PTH, INTACT AND CALCIUM
Calcium: 10.8 mg/dL — ABNORMAL HIGH (ref 8.6–10.4)
PTH: 90 pg/mL — ABNORMAL HIGH (ref 16–77)

## 2021-08-17 ENCOUNTER — Ambulatory Visit: Payer: BC Managed Care – PPO | Admitting: Endocrinology

## 2021-08-17 ENCOUNTER — Other Ambulatory Visit: Payer: Self-pay

## 2021-08-17 DIAGNOSIS — R609 Edema, unspecified: Secondary | ICD-10-CM

## 2021-08-17 LAB — BRAIN NATRIURETIC PEPTIDE: Pro B Natriuretic peptide (BNP): 4 pg/mL (ref 0.0–100.0)

## 2021-08-17 LAB — BASIC METABOLIC PANEL
BUN: 7 mg/dL (ref 6–23)
CO2: 31 mEq/L (ref 19–32)
Calcium: 10.6 mg/dL — ABNORMAL HIGH (ref 8.4–10.5)
Chloride: 104 mEq/L (ref 96–112)
Creatinine, Ser: 0.68 mg/dL (ref 0.40–1.20)
GFR: 98.33 mL/min (ref >60.00)
Glucose, Bld: 90 mg/dL (ref 70–99)
Potassium: 3.9 mEq/L (ref 3.5–5.1)
Sodium: 142 mEq/L (ref 135–145)

## 2021-08-17 LAB — VITAMIN D 25 HYDROXY (VIT D DEFICIENCY, FRACTURES): VITD: 29.16 ng/mL — ABNORMAL LOW (ref 30.00–100.00)

## 2021-08-17 MED ORDER — DILTIAZEM HCL ER COATED BEADS 120 MG PO TB24: 120.0000 mg | ORAL_TABLET | Freq: Every day | ORAL | 11 refills | Status: DC

## 2021-08-17 NOTE — Patient Instructions (Addendum)
Blood tests are requested for you today.  We'll let you know about the results.   I have sent a prescription to your pharmacy, to add diltiazem.   Please come back for a follow-up appointment in 4 weeks.

## 2021-08-17 NOTE — Progress Notes (Signed)
Subjective:    Patient ID: Paula Crawford, female    DOB: 10/11/1966, 55 y.o.   MRN: [id number]  HPI Pt returns for f/u of hyperparathyroidism (pt was noted to have hypercalcemia in 2022; only bony fx was ankle (2011--traumatic). she has never had osteoporosis or urolithiasis; we have to rx HTN in order to reduce HCTZ).  She takes vitamin-D, 2000 units/d.  pt states she feels well in general. 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   Hypertension    Insomnia    MRSA infection 2018   stomach area - results not in EPIC   Obese    Prediabetes     Past Surgical History:  Procedure Laterality Date   ABDOMINAL HYSTERECTOMY N/A 07/08/2013   Procedure: HYSTERECTOMY ABDOMINAL;  Surgeon: Eldred Manges, MD;  Location: [facility];  Service: Gynecology;  Laterality: N/A;   BIOPSY  08/08/2020   Procedure: BIOPSY;  Surgeon: Rush Landmark Telford Nab., MD;  Location: [place];  Service: Gastroenterology;;   BREAST LUMPECTOMY WITH RADIOACTIVE SEED LOCALIZATION Right 09/02/2019   Procedure: RIGHT BREAST LUMPECTOMY WITH RADIOACTIVE SEED LOCALIZATION;  Surgeon: Fanny Skates, MD;  Location: [place];  Service: General;  Laterality: Right;   CESAREAN SECTION     COLONOSCOPY     COLONOSCOPY WITH PROPOFOL N/A 08/08/2020   Procedure: COLONOSCOPY WITH PROPOFOL;  Surgeon: Irving Copas., MD;  Location: [place];  Service: Gastroenterology;  Laterality: N/A;   ENDOSCOPIC MUCOSAL RESECTION N/A 08/03/2019   Procedure: ENDOSCOPIC MUCOSAL RESECTION;  Surgeon: Rush Landmark Telford Nab., MD;  Location: [place];  Service: Gastroenterology;  Laterality: N/A;   FLEXIBLE SIGMOIDOSCOPY N/A 08/03/2019   Procedure: FLEXIBLE SIGMOIDOSCOPY;  Surgeon: Rush Landmark Telford Nab., MD;  Location: [place];  Service:  Gastroenterology;  Laterality: N/A;   HEMOSTASIS CLIP PLACEMENT  08/03/2019   Procedure: HEMOSTASIS CLIP PLACEMENT;  Surgeon: Irving Copas., MD;  Location: [place];  Service: Gastroenterology;;   POLYPECTOMY  08/08/2020   Procedure: POLYPECTOMY;  Surgeon: Irving Copas., MD;  Location: [place];  Service: Gastroenterology;;   Clide Deutscher  08/03/2019   Procedure: Clide Deutscher;  Surgeon: Mansouraty, Telford Nab., MD;  Location: [place];  Service: Gastroenterology;;   SUBMUCOSAL LIFTING INJECTION  08/03/2019   Procedure: SUBMUCOSAL LIFTING INJECTION;  Surgeon: Irving Copas., MD;  Location: [place];  Service: Gastroenterology;;   [place] INJECTION  08/08/2020   Procedure: SUBMUCOSAL LIFTING INJECTION;  Surgeon: Irving Copas., MD;  Location: [place];  Service: Gastroenterology;;   SUBMUCOSAL TATTOO INJECTION  08/08/2020   Procedure: SUBMUCOSAL TATTOO INJECTION;  Surgeon: Irving Copas., MD;  Location: [place];  Service: Gastroenterology;;   TOTAL ABDOMINAL HYSTERECTOMY  2014    Social History   Socioeconomic History   Marital status: Married    Spouse name: Not on file   Number of children: Not on file   Years of education: Not on file   Highest education level: Not on file  Occupational History   Not on file  Tobacco Use   Smoking status: Never   Smokeless tobacco: Never  Vaping Use   Vaping Use: Never used  Substance and Sexual Activity   Alcohol use: Not Currently    Comment: occ    Drug use: No   Sexual activity: Not on  file    Comment: Hysterectomy  Other Topics Concern   Not on file  Social History Narrative   Not on file   Social Determinants of Health   Financial Resource Strain: Not on file  Food Insecurity: Not on file  Transportation Needs: Not on file  Physical Activity: Not on file  Stress: Not on file  Social Connections: Not on file  Intimate Partner Violence: Not on file     Current Outpatient Medications on File Prior to Visit  Medication Sig Dispense Refill   aspirin EC 81 MG tablet Take 81 mg by mouth daily.     aspirin-acetaminophen-caffeine (EXCEDRIN MIGRAINE) 250-250-65 MG tablet Take 2 tablets by mouth every 6 (six) hours as needed for headache.     Biotin w/ Vitamins C & E (HAIR/SKIN/NAILS PO) Take 1 tablet by mouth every evening.     gabapentin (NEURONTIN) 300 MG capsule Take 600 mg by mouth at bedtime.     ibuprofen (ADVIL) 200 MG tablet Take 600 mg by mouth every 6 (six) hours as needed for headache or moderate pain.     losartan-hydrochlorothiazide (HYZAAR) 100-12.5 MG tablet Take 1 tablet by mouth daily. 90 tablet 3   metFORMIN (GLUCOPHAGE-XR) 500 MG 24 hr tablet Take 500 mg by mouth daily.     Potassium 99 MG TABS Take 99 mg by mouth at bedtime.     doxycycline (VIBRAMYCIN) 100 MG capsule Take 100 mg by mouth 2 (two) times daily.     HYDROcodone-acetaminophen (NORCO) 5-325 MG tablet Take 1-2 tablets by mouth every 6 (six) hours as needed for moderate pain or severe pain. 20 tablet 0   penicillin v potassium (VEETID) 500 MG tablet Take 500 mg by mouth 4 (four) times daily.     No current facility-administered medications on file prior to visit.    Allergies  Allergen Reactions   Morphine And Related Itching    Family History  Problem Relation Age of Onset   Heart disease Mother    Heart murmur Mother    Healthy Son    Colon cancer Neg Hx    Esophageal cancer Neg Hx    Inflammatory bowel disease Neg Hx    Liver disease Neg Hx    Pancreatic cancer Neg Hx    Rectal cancer Neg Hx    Stomach cancer Neg Hx    Hyperparathyroidism Neg Hx     BP (!) 170/84 (BP Location: Right Arm, Patient Position: Sitting, Cuff Size: Large)   Pulse (!) 115   Ht 5\' 1"  (1.549 m)   Wt 255 lb 3.2 oz (115.8 kg)   LMP 06/30/2013 (LMP Unknown)   SpO2 95%   BMI 48.22 kg/m    Review of Systems Denies sob    Objective:   Physical Exam VITAL SIGNS:   See vs page GENERAL: no distress Ext: no leg edema.        Assessment & Plan:  HTN: uncontrolled.  Primary hyperparathyroidism: recheck today.     Patient Instructions  Blood tests are requested for you today.  We'll let you know about the results.   I have sent a prescription to your pharmacy, to add diltiazem.   Please come back for a follow-up appointment in 4 weeks.

## 2021-08-18 LAB — PTH, INTACT AND CALCIUM
Calcium: 10.6 mg/dL — ABNORMAL HIGH (ref 8.6–10.4)
PTH: 115 pg/mL — ABNORMAL HIGH (ref 16–77)

## 2021-09-18 ENCOUNTER — Ambulatory Visit: Payer: BC Managed Care – PPO | Admitting: Endocrinology

## 2021-09-19 ENCOUNTER — Other Ambulatory Visit: Payer: Self-pay

## 2021-09-19 ENCOUNTER — Ambulatory Visit: Payer: BC Managed Care – PPO | Admitting: Endocrinology

## 2021-09-19 VITALS — BP 150/72 | HR 120 | Ht 61.0 in | Wt 253.0 lb

## 2021-09-19 DIAGNOSIS — R000 Tachycardia, unspecified: Secondary | ICD-10-CM

## 2021-09-19 LAB — CBC WITH DIFFERENTIAL/PLATELET
Basophils Absolute: 0 10*3/uL (ref 0.0–0.1)
Basophils Relative: 0.5 % (ref 0.0–3.0)
Eosinophils Absolute: 0.1 10*3/uL (ref 0.0–0.7)
Eosinophils Relative: 1.9 % (ref 0.0–5.0)
HCT: 39.6 % (ref 36.0–46.0)
Hemoglobin: 12.6 g/dL (ref 12.0–15.0)
Lymphocytes Relative: 37.6 % (ref 12.0–46.0)
Lymphs Abs: 1.9 10*3/uL (ref 0.7–4.0)
MCHC: 31.8 g/dL (ref 30.0–36.0)
MCV: 85.6 fl (ref 78.0–100.0)
Monocytes Absolute: 0.4 10*3/uL (ref 0.1–1.0)
Monocytes Relative: 6.8 % (ref 3.0–12.0)
Neutro Abs: 2.7 10*3/uL (ref 1.4–7.7)
Neutrophils Relative %: 53.2 % (ref 43.0–77.0)
Platelets: 307 10*3/uL (ref 150.0–400.0)
RBC: 4.63 Mil/uL (ref 3.87–5.11)
RDW: 14.9 % (ref 11.5–15.5)
WBC: 5.1 10*3/uL (ref 4.0–10.5)

## 2021-09-19 LAB — IBC PANEL
Iron: 57 ug/dL (ref 42–145)
Saturation Ratios: 14.8 % — ABNORMAL LOW (ref 20.0–50.0)
TIBC: 385 ug/dL (ref 250.0–450.0)
Transferrin: 275 mg/dL (ref 212.0–360.0)

## 2021-09-19 MED ORDER — DILTIAZEM HCL ER COATED BEADS 180 MG PO CP24: 180.0000 mg | ORAL_CAPSULE | Freq: Every day | ORAL | 3 refills | Status: DC

## 2021-09-19 NOTE — Progress Notes (Signed)
   Subjective:    Patient ID: Paula Crawford, female    DOB: 01-23-1966, 55 y.o.   MRN: [id number]  HPI Pt returns for f/u of hyperparathyroidism (pt was noted to have hypercalcemia in 2022; only bony fx was ankle (2011--traumatic). she has never had osteoporosis or urolithiasis; we have to rx HTN in order to reduce HCTZ; she takes vitamin-D, 2000 units/d).  pt states she feels well in general.  She denies palpitations.  She has h/o anemia, s/p fe infusion.  She does not take fe tabs.  Review of Systems     Objective:   Physical Exam    Lab Results  Component Value Date   TSH 1.58 07/13/2021   Lab Results  Component Value Date   CREATININE 0.68 08/17/2021   BUN 7 08/17/2021   NA 142 08/17/2021   K 3.9 08/17/2021   CL 104 08/17/2021   CO2 31 08/17/2021   Lab Results  Component Value Date   PTH 115 (H) 08/17/2021   CALCIUM 10.6 (H) 08/17/2021   CALCIUM 10.6 (H) 08/17/2021       Assessment & Plan:  HTN: uncontrolled Hyperpth: recheck today Anemia: recheck today  Patient Instructions  Blood tests are requested for you today.  We'll let you know about the results.   I have sent a prescription to your pharmacy, to increase the diltiazem.   Please come back for a follow-up appointment in 4 weeks.

## 2021-09-19 NOTE — Patient Instructions (Addendum)
Blood tests are requested for you today.  We'll let you know about the results.   I have sent a prescription to your pharmacy, to increase the diltiazem.   Please come back for a follow-up appointment in 4 weeks.

## 2021-10-02 LAB — METANEPHRINES, PLASMA
Metanephrine, Free: 48 pg/mL (ref <=57)
Normetanephrine, Free: 215 pg/mL — ABNORMAL HIGH (ref <=148)
Total Metanephrines-Plasma: 263 pg/mL — ABNORMAL HIGH (ref <=205)

## 2021-10-02 LAB — CATECHOLAMINES, FRACTIONATED, PLASMA
Dopamine: 34 pg/mL — ABNORMAL HIGH
Epinephrine: <20 pg/mL
Norepinephrine: 1085 pg/mL
Total Catecholamines: 1119 pg/mL

## 2021-10-17 ENCOUNTER — Ambulatory Visit: Payer: BC Managed Care – PPO | Admitting: Endocrinology

## 2021-10-17 ENCOUNTER — Other Ambulatory Visit: Payer: Self-pay

## 2021-10-17 MED ORDER — LOSARTAN POTASSIUM-HCTZ 100-12.5 MG PO TABS: 1.0000 | ORAL_TABLET | Freq: Every day | ORAL | 3 refills | Status: DC

## 2021-10-17 MED ORDER — FUROSEMIDE 20 MG PO TABS: 20.0000 mg | ORAL_TABLET | Freq: Every day | ORAL | 3 refills | Status: DC

## 2021-10-17 NOTE — Progress Notes (Signed)
Subjective:    Patient ID: Paula Crawford, female    DOB: 1966/04/12, 55 y.o.   MRN: [id number]  HPI Pt returns for f/u of hyperparathyroidism (pt was noted to have hypercalcemia in 2022; only bony fx was ankle (2011--traumatic). she has never had osteoporosis or urolithiasis; we have to rx HTN in order to reduce HCTZ; she takes vitamin-D, 2000 units/d).  pt states she feels well in general.  She denies palpitations.  She has been out of hyzaar x 1 week.   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   Hypertension    Insomnia    MRSA infection 2018   stomach area - results not in EPIC   Obese    Prediabetes     Past Surgical History:  Procedure Laterality Date   ABDOMINAL HYSTERECTOMY N/A 07/08/2013   Procedure: HYSTERECTOMY ABDOMINAL;  Surgeon: Eldred Manges, MD;  Location: [facility];  Service: Gynecology;  Laterality: N/A;   BIOPSY  08/08/2020   Procedure: BIOPSY;  Surgeon: Rush Landmark Telford Nab., MD;  Location: [place];  Service: Gastroenterology;;   BREAST LUMPECTOMY WITH RADIOACTIVE SEED LOCALIZATION Right 09/02/2019   Procedure: RIGHT BREAST LUMPECTOMY WITH RADIOACTIVE SEED LOCALIZATION;  Surgeon: Fanny Skates, MD;  Location: [place];  Service: General;  Laterality: Right;   CESAREAN SECTION     COLONOSCOPY     COLONOSCOPY WITH PROPOFOL N/A 08/08/2020   Procedure: COLONOSCOPY WITH PROPOFOL;  Surgeon: Irving Copas., MD;  Location: [place];  Service: Gastroenterology;  Laterality: N/A;   ENDOSCOPIC MUCOSAL RESECTION N/A 08/03/2019   Procedure: ENDOSCOPIC MUCOSAL RESECTION;  Surgeon: Rush Landmark Telford Nab., MD;  Location: [place];  Service: Gastroenterology;  Laterality: N/A;   FLEXIBLE SIGMOIDOSCOPY N/A 08/03/2019   Procedure: FLEXIBLE SIGMOIDOSCOPY;  Surgeon:  Rush Landmark Telford Nab., MD;  Location: [place];  Service: Gastroenterology;  Laterality: N/A;   HEMOSTASIS CLIP PLACEMENT  08/03/2019   Procedure: HEMOSTASIS CLIP PLACEMENT;  Surgeon: Irving Copas., MD;  Location: [place];  Service: Gastroenterology;;   POLYPECTOMY  08/08/2020   Procedure: POLYPECTOMY;  Surgeon: Irving Copas., MD;  Location: [place];  Service: Gastroenterology;;   Clide Deutscher  08/03/2019   Procedure: Clide Deutscher;  Surgeon: Mansouraty, Telford Nab., MD;  Location: [place];  Service: Gastroenterology;;   SUBMUCOSAL LIFTING INJECTION  08/03/2019   Procedure: SUBMUCOSAL LIFTING INJECTION;  Surgeon: Irving Copas., MD;  Location: [place];  Service: Gastroenterology;;   [place] INJECTION  08/08/2020   Procedure: SUBMUCOSAL LIFTING INJECTION;  Surgeon: Irving Copas., MD;  Location: [place];  Service: Gastroenterology;;   SUBMUCOSAL TATTOO INJECTION  08/08/2020   Procedure: SUBMUCOSAL TATTOO INJECTION;  Surgeon: Irving Copas., MD;  Location: [place];  Service: Gastroenterology;;   TOTAL ABDOMINAL HYSTERECTOMY  2014    Social History   Socioeconomic History   Marital status: Married    Spouse name: Not on file   Number of children: Not on file   Years of education: Not on file   Highest education level: Not on file  Occupational History   Not on file  Tobacco Use   Smoking status: Never   Smokeless tobacco: Never  Vaping Use   Vaping Use: Never used  Substance and Sexual Activity   Alcohol use: Not Currently  Comment: occ    Drug use: No   Sexual activity: Not on file    Comment: Hysterectomy  Other Topics Concern   Not on file  Social History Narrative   Not on file   Social Determinants of Health   Financial Resource Strain: Not on file  Food Insecurity: Not on file  Transportation Needs: Not on file  Physical Activity: Not on file  Stress: Not on file  Social  Connections: Not on file  Intimate Partner Violence: Not on file    Current Outpatient Medications on File Prior to Visit  Medication Sig Dispense Refill   aspirin EC 81 MG tablet Take 81 mg by mouth daily.     aspirin-acetaminophen-caffeine (EXCEDRIN MIGRAINE) 250-250-65 MG tablet Take 2 tablets by mouth every 6 (six) hours as needed for headache.     Biotin w/ Vitamins C & E (HAIR/SKIN/NAILS PO) Take 1 tablet by mouth every evening.     diltiazem (CARDIZEM CD) 180 MG 24 hr capsule Take 1 capsule (180 mg total) by mouth daily. 90 capsule 3   gabapentin (NEURONTIN) 300 MG capsule Take 600 mg by mouth at bedtime.     ibuprofen (ADVIL) 200 MG tablet Take 600 mg by mouth every 6 (six) hours as needed for headache or moderate pain.     metFORMIN (GLUCOPHAGE-XR) 500 MG 24 hr tablet Take 500 mg by mouth daily.     Potassium 99 MG TABS Take 99 mg by mouth at bedtime.     penicillin v potassium (VEETID) 500 MG tablet Take 500 mg by mouth 4 (four) times daily.     No current facility-administered medications on file prior to visit.    Allergies  Allergen Reactions   Morphine And Related Itching    Family History  Problem Relation Age of Onset   Heart disease Mother    Heart murmur Mother    Healthy Son    Colon cancer Neg Hx    Esophageal cancer Neg Hx    Inflammatory bowel disease Neg Hx    Liver disease Neg Hx    Pancreatic cancer Neg Hx    Rectal cancer Neg Hx    Stomach cancer Neg Hx    Hyperparathyroidism Neg Hx     BP (!) 184/100   Pulse (!) 112   Ht 5\' 1"  (1.549 m)   Wt 255 lb 12.8 oz (116 kg)   LMP 06/30/2013 (LMP Unknown)   SpO2 97%   BMI 48.33 kg/m    Review of Systems     Objective:   Physical Exam EXT: 1+ bilat leg edema   Lab Results  Component Value Date   CREATININE 0.68 08/17/2021   BUN 7 08/17/2021   NA 142 08/17/2021   K 3.9 08/17/2021   CL 104 08/17/2021   CO2 31 08/17/2021      Assessment & Plan:  HTN: uncontrolled HyperPTH: recheck  today  Patient Instructions  I have sent 2 prescriptions to your pharmacy: to add furosemide, and to refill the losartan-HCTZ. Please continue the same other medications Please come back for a follow-up appointment in 1 month.

## 2021-10-17 NOTE — Patient Instructions (Addendum)
I have sent 2 prescriptions to your pharmacy: to add furosemide, and to refill the losartan-HCTZ. Please continue the same other medications Please come back for a follow-up appointment in 1 month.

## 2021-11-23 ENCOUNTER — Other Ambulatory Visit: Payer: Self-pay

## 2021-11-23 ENCOUNTER — Ambulatory Visit: Payer: BC Managed Care – PPO | Admitting: Endocrinology

## 2021-11-23 ENCOUNTER — Telehealth: Payer: Self-pay | Admitting: Endocrinology

## 2021-11-23 DIAGNOSIS — I10 Essential (primary) hypertension: Secondary | ICD-10-CM | POA: Diagnosis not present

## 2021-11-23 MED ORDER — DILTIAZEM HCL ER 240 MG PO CP24: 240.0000 mg | ORAL_CAPSULE | Freq: Every day | ORAL | 3 refills | Status: DC

## 2021-11-23 NOTE — Telephone Encounter (Signed)
Paula Crawford with [facility] called re: PHARM received Rx for diltiazem (DILACOR XR) 240 MG 24 hr capsule, however, PHARM requests a new RX for diltiazem ER instead due to the difference in cost. Paula Crawford requests to be called at ph# [phone] to be advised on the message above.

## 2021-11-23 NOTE — Progress Notes (Signed)
Subjective:    Patient ID: Paula Crawford, female    DOB: [birth year], 56 y.o.   MRN: [id number]  HPI Pt returns for f/u of hyperparathyroidism (pt was noted to have hypercalcemia in 2022; only bony fx was ankle (2011--traumatic). she has never had osteoporosis or urolithiasis; we have to rx HTN in order to reduce HCTZ; she takes vitamin-D, 4000 units/d).  pt states she feels well in general.  She denies palpitations.  She takes meds as rx'ed.   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   Hypertension    Insomnia    MRSA infection 2018   stomach area - results not in EPIC   Obese    Prediabetes     Past Surgical History:  Procedure Laterality Date   ABDOMINAL HYSTERECTOMY N/A 07/08/2013   Procedure: HYSTERECTOMY ABDOMINAL;  Surgeon: Eldred Manges, MD;  Location: [facility];  Service: Gynecology;  Laterality: N/A;   BIOPSY  08/08/2020   Procedure: BIOPSY;  Surgeon: Rush Landmark Telford Nab., MD;  Location: [place];  Service: Gastroenterology;;   BREAST LUMPECTOMY WITH RADIOACTIVE SEED LOCALIZATION Right 09/02/2019   Procedure: RIGHT BREAST LUMPECTOMY WITH RADIOACTIVE SEED LOCALIZATION;  Surgeon: Fanny Skates, MD;  Location: [place];  Service: General;  Laterality: Right;   CESAREAN SECTION     COLONOSCOPY     COLONOSCOPY WITH PROPOFOL N/A 08/08/2020   Procedure: COLONOSCOPY WITH PROPOFOL;  Surgeon: Irving Copas., MD;  Location: [place];  Service: Gastroenterology;  Laterality: N/A;   ENDOSCOPIC MUCOSAL RESECTION N/A 08/03/2019   Procedure: ENDOSCOPIC MUCOSAL RESECTION;  Surgeon: Rush Landmark Telford Nab., MD;  Location: [place];  Service: Gastroenterology;  Laterality: N/A;   FLEXIBLE SIGMOIDOSCOPY N/A 08/03/2019   Procedure: FLEXIBLE SIGMOIDOSCOPY;  Surgeon: Rush Landmark  Telford Nab., MD;  Location: [place];  Service: Gastroenterology;  Laterality: N/A;   HEMOSTASIS CLIP PLACEMENT  08/03/2019   Procedure: HEMOSTASIS CLIP PLACEMENT;  Surgeon: Irving Copas., MD;  Location: [place];  Service: Gastroenterology;;   POLYPECTOMY  08/08/2020   Procedure: POLYPECTOMY;  Surgeon: Irving Copas., MD;  Location: [place];  Service: Gastroenterology;;   Clide Deutscher  08/03/2019   Procedure: Clide Deutscher;  Surgeon: Mansouraty, Telford Nab., MD;  Location: [place];  Service: Gastroenterology;;   SUBMUCOSAL LIFTING INJECTION  08/03/2019   Procedure: SUBMUCOSAL LIFTING INJECTION;  Surgeon: Irving Copas., MD;  Location: [place];  Service: Gastroenterology;;   [place] INJECTION  08/08/2020   Procedure: SUBMUCOSAL LIFTING INJECTION;  Surgeon: Irving Copas., MD;  Location: [place];  Service: Gastroenterology;;   SUBMUCOSAL TATTOO INJECTION  08/08/2020   Procedure: SUBMUCOSAL TATTOO INJECTION;  Surgeon: Irving Copas., MD;  Location: [place];  Service: Gastroenterology;;   TOTAL ABDOMINAL HYSTERECTOMY  2014    Social History   Socioeconomic History   Marital status: Married    Spouse name: Not on file   Number of children: Not on file   Years of education: Not on file   Highest education level: Not on file  Occupational History   Not on file  Tobacco Use   Smoking status: Never   Smokeless tobacco: Never  Vaping Use   Vaping Use: Never used  Substance and Sexual Activity   Alcohol use: Not Currently    Comment: occ  Drug use: No   Sexual activity: Not on file    Comment: Hysterectomy  Other Topics Concern   Not on file  Social History Narrative   Not on file   Social Determinants of Health   Financial Resource Strain: Not on file  Food Insecurity: Not on file  Transportation Needs: Not on file  Physical Activity: Not on file  Stress: Not on file  Social Connections: Not  on file  Intimate Partner Violence: Not on file    Current Outpatient Medications on File Prior to Visit  Medication Sig Dispense Refill   aspirin EC 81 MG tablet Take 81 mg by mouth daily.     aspirin-acetaminophen-caffeine (EXCEDRIN MIGRAINE) 250-250-65 MG tablet Take 2 tablets by mouth every 6 (six) hours as needed for headache.     Biotin w/ Vitamins C & E (HAIR/SKIN/NAILS PO) Take 1 tablet by mouth every evening.     furosemide (LASIX) 20 MG tablet Take 1 tablet (20 mg total) by mouth daily. 90 tablet 3   gabapentin (NEURONTIN) 300 MG capsule Take 600 mg by mouth at bedtime.     ibuprofen (ADVIL) 200 MG tablet Take 600 mg by mouth every 6 (six) hours as needed for headache or moderate pain.     losartan-hydrochlorothiazide (HYZAAR) 100-12.5 MG tablet Take 1 tablet by mouth daily. 90 tablet 3   metFORMIN (GLUCOPHAGE-XR) 500 MG 24 hr tablet Take 500 mg by mouth daily.     Potassium 99 MG TABS Take 99 mg by mouth at bedtime.     penicillin v potassium (VEETID) 500 MG tablet Take 500 mg by mouth 4 (four) times daily.     No current facility-administered medications on file prior to visit.    Allergies  Allergen Reactions   Morphine And Related Itching    Family History  Problem Relation Age of Onset   Heart disease Mother    Heart murmur Mother    Healthy Son    Colon cancer Neg Hx    Esophageal cancer Neg Hx    Inflammatory bowel disease Neg Hx    Liver disease Neg Hx    Pancreatic cancer Neg Hx    Rectal cancer Neg Hx    Stomach cancer Neg Hx    Hyperparathyroidism Neg Hx     BP (!) 158/92    Pulse (!) 102    Ht 5\' 1"  (1.549 m)    Wt 251 lb 3.2 oz (113.9 kg)    LMP 06/30/2013 (LMP Unknown)    SpO2 97%    BMI 47.46 kg/m    Review of Systems     Objective:   Physical Exam VITAL SIGNS:  See vs page GENERAL: no distress EXT: no leg edema.        Assessment & Plan:  HTN: uncontrolled.  The next step is to improve control of BP  Patient Instructions  I have  sent a prescription to your pharmacy, to increase the diltiazem.   Please continue the same other medications.   Please come back for a follow-up appointment in 1 month.

## 2021-11-23 NOTE — Patient Instructions (Addendum)
I have sent a prescription to your pharmacy, to increase the diltiazem.   Please continue the same other medications.   Please come back for a follow-up appointment in 1 month.

## 2021-11-25 DIAGNOSIS — I10 Essential (primary) hypertension: Secondary | ICD-10-CM | POA: Insufficient documentation

## 2021-12-29 ENCOUNTER — Ambulatory Visit: Payer: BC Managed Care – PPO | Admitting: Endocrinology

## 2021-12-29 ENCOUNTER — Other Ambulatory Visit: Payer: Self-pay

## 2021-12-29 VITALS — BP 180/90 | HR 106 | Ht 61.0 in | Wt 256.0 lb

## 2021-12-29 DIAGNOSIS — I10 Essential (primary) hypertension: Secondary | ICD-10-CM

## 2021-12-29 MED ORDER — DILTIAZEM HCL ER COATED BEADS 360 MG PO CP24
360.0000 mg | ORAL_CAPSULE | Freq: Every day | ORAL | 3 refills | Status: DC
Start: 2021-12-29 — End: 2023-03-13

## 2021-12-29 NOTE — Patient Instructions (Addendum)
I have sent a prescription to your pharmacy, to increase the diltiazem again.   Please continue the same other medications.    Please come back for a follow-up appointment in 1 month.

## 2021-12-29 NOTE — Progress Notes (Signed)
Subjective:    Patient ID: Paula Crawford, female    DOB: 10-25-1966, 56 y.o.   MRN: [id number]  HPI Pt returns for f/u of hyperparathyroidism (pt was noted to have hypercalcemia in 2022; only bony fx was ankle (2011--traumatic). she has never had osteoporosis or urolithiasis; we have to rx HTN in order to reduce HCTZ; she takes vitamin-D, 4000 units/d).  pt states she feels well in general.  She denies palpitations.  She takes meds as rx'ed.   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   Hypertension    Insomnia    MRSA infection 2018   stomach area - results not in EPIC   Obese    Prediabetes     Past Surgical History:  Procedure Laterality Date   ABDOMINAL HYSTERECTOMY N/A 07/08/2013   Procedure: HYSTERECTOMY ABDOMINAL;  Surgeon: Eldred Manges, MD;  Location: [facility];  Service: Gynecology;  Laterality: N/A;   BIOPSY  08/08/2020   Procedure: BIOPSY;  Surgeon: Rush Landmark Telford Nab., MD;  Location: [place];  Service: Gastroenterology;;   BREAST LUMPECTOMY WITH RADIOACTIVE SEED LOCALIZATION Right 09/02/2019   Procedure: RIGHT BREAST LUMPECTOMY WITH RADIOACTIVE SEED LOCALIZATION;  Surgeon: Fanny Skates, MD;  Location: [place];  Service: General;  Laterality: Right;   CESAREAN SECTION     COLONOSCOPY     COLONOSCOPY WITH PROPOFOL N/A 08/08/2020   Procedure: COLONOSCOPY WITH PROPOFOL;  Surgeon: Irving Copas., MD;  Location: [place];  Service: Gastroenterology;  Laterality: N/A;   ENDOSCOPIC MUCOSAL RESECTION N/A 08/03/2019   Procedure: ENDOSCOPIC MUCOSAL RESECTION;  Surgeon: Rush Landmark Telford Nab., MD;  Location: [place];  Service: Gastroenterology;  Laterality: N/A;   FLEXIBLE SIGMOIDOSCOPY N/A 08/03/2019   Procedure: FLEXIBLE SIGMOIDOSCOPY;  Surgeon: Rush Landmark  Telford Nab., MD;  Location: [place];  Service: Gastroenterology;  Laterality: N/A;   HEMOSTASIS CLIP PLACEMENT  08/03/2019   Procedure: HEMOSTASIS CLIP PLACEMENT;  Surgeon: Irving Copas., MD;  Location: [place];  Service: Gastroenterology;;   POLYPECTOMY  08/08/2020   Procedure: POLYPECTOMY;  Surgeon: Irving Copas., MD;  Location: [place];  Service: Gastroenterology;;   Clide Deutscher  08/03/2019   Procedure: Clide Deutscher;  Surgeon: Mansouraty, Telford Nab., MD;  Location: [place];  Service: Gastroenterology;;   SUBMUCOSAL LIFTING INJECTION  08/03/2019   Procedure: SUBMUCOSAL LIFTING INJECTION;  Surgeon: Irving Copas., MD;  Location: [place];  Service: Gastroenterology;;   [place] INJECTION  08/08/2020   Procedure: SUBMUCOSAL LIFTING INJECTION;  Surgeon: Irving Copas., MD;  Location: [place];  Service: Gastroenterology;;   SUBMUCOSAL TATTOO INJECTION  08/08/2020   Procedure: SUBMUCOSAL TATTOO INJECTION;  Surgeon: Irving Copas., MD;  Location: [place];  Service: Gastroenterology;;   TOTAL ABDOMINAL HYSTERECTOMY  2014    Social History   Socioeconomic History   Marital status: Married    Spouse name: Not on file   Number of children: Not on file   Years of education: Not on file   Highest education level: Not on file  Occupational History   Not on file  Tobacco Use   Smoking status: Never   Smokeless tobacco: Never  Vaping Use   Vaping Use: Never used  Substance and Sexual Activity   Alcohol use: Not Currently    Comment: occ  Drug use: No   Sexual activity: Not on file    Comment: Hysterectomy  Other Topics Concern   Not on file  Social History Narrative   Not on file   Social Determinants of Health   Financial Resource Strain: Not on file  Food Insecurity: Not on file  Transportation Needs: Not on file  Physical Activity: Not on file  Stress: Not on file  Social Connections: Not  on file  Intimate Partner Violence: Not on file    Current Outpatient Medications on File Prior to Visit  Medication Sig Dispense Refill   aspirin EC 81 MG tablet Take 81 mg by mouth daily.     aspirin-acetaminophen-caffeine (EXCEDRIN MIGRAINE) 250-250-65 MG tablet Take 2 tablets by mouth every 6 (six) hours as needed for headache.     Biotin w/ Vitamins C & E (HAIR/SKIN/NAILS PO) Take 1 tablet by mouth every evening.     furosemide (LASIX) 20 MG tablet Take 1 tablet (20 mg total) by mouth daily. 90 tablet 3   gabapentin (NEURONTIN) 300 MG capsule Take 600 mg by mouth at bedtime.     ibuprofen (ADVIL) 200 MG tablet Take 600 mg by mouth every 6 (six) hours as needed for headache or moderate pain.     losartan-hydrochlorothiazide (HYZAAR) 100-12.5 MG tablet Take 1 tablet by mouth daily. 90 tablet 3   metFORMIN (GLUCOPHAGE-XR) 500 MG 24 hr tablet Take 500 mg by mouth daily.     Potassium 99 MG TABS Take 99 mg by mouth at bedtime.     penicillin v potassium (VEETID) 500 MG tablet Take 500 mg by mouth 4 (four) times daily.     No current facility-administered medications on file prior to visit.    Allergies  Allergen Reactions   Morphine And Related Itching    Family History  Problem Relation Age of Onset   Heart disease Mother    Heart murmur Mother    Healthy Son    Colon cancer Neg Hx    Esophageal cancer Neg Hx    Inflammatory bowel disease Neg Hx    Liver disease Neg Hx    Pancreatic cancer Neg Hx    Rectal cancer Neg Hx    Stomach cancer Neg Hx    Hyperparathyroidism Neg Hx     BP (!) 180/90    Pulse (!) 106    Ht 5\' 1"  (1.549 m)    Wt 256 lb (116.1 kg)    LMP 06/30/2013 (LMP Unknown)    SpO2 94%    BMI 48.37 kg/m    Review of Systems Denies sob    Objective:   Physical Exam VITAL SIGNS:  See vs page GENERAL: no distress EXT: trace bilat leg edema   Lab Results  Component Value Date   PTH 115 (H) 08/17/2021   CALCIUM 10.6 (H) 08/17/2021   CALCIUM 10.6 (H)  08/17/2021       Assessment & Plan:  HTN: uncontrolled.  We have to address this before we can safely phase out HCTZ  Patient Instructions  I have sent a prescription to your pharmacy, to increase the diltiazem again.   Please continue the same other medications.    Please come back for a follow-up appointment in 1 month.

## 2022-01-26 ENCOUNTER — Ambulatory Visit: Payer: BC Managed Care – PPO | Admitting: Endocrinology

## 2022-02-23 ENCOUNTER — Telehealth: Payer: Self-pay

## 2022-02-23 NOTE — Telephone Encounter (Signed)
Nurse practitioner from the [facility] wants to know if theres any other medications that should be aware of that the patient should avoid regarding her hypertension since Paula Crawford is a new patient at their practice. She is aware that she is to avoid HCTZ. ?

## 2022-02-26 NOTE — Telephone Encounter (Signed)
[name] NP has now been informed that the patient is just to avoid HCTZ. ?

## 2022-03-06 ENCOUNTER — Ambulatory Visit: Payer: BC Managed Care – PPO | Admitting: Endocrinology

## 2022-03-26 ENCOUNTER — Ambulatory Visit (INDEPENDENT_AMBULATORY_CARE_PROVIDER_SITE_OTHER): Payer: BC Managed Care – PPO | Admitting: Endocrinology

## 2022-03-26 ENCOUNTER — Encounter: Payer: Self-pay | Admitting: Endocrinology

## 2022-03-26 DIAGNOSIS — E559 Vitamin D deficiency, unspecified: Secondary | ICD-10-CM

## 2022-03-26 DIAGNOSIS — I10 Essential (primary) hypertension: Secondary | ICD-10-CM | POA: Diagnosis not present

## 2022-03-26 NOTE — Progress Notes (Signed)
Patient ID: Paula Crawford, female   DOB: 10-Jul-1966, 56 y.o.   MRN: [id number]           Chief complaint: High calcium  History of Present Illness:    Review of records show that she has had a high calcium since 06/2019 Detailed review of previous records, labs and other studies was done  She was referred to have an endocrinology consultation in 07/2021  Initially she was not recommended intervention because of only mild increase in calcium and HCTZ was stopped She was not recommended any treatment or intervention and she is now here for follow-up  Lab Results  Component Value Date   CALCIUM 10.6 (H) 08/17/2021   CALCIUM 10.6 (H) 08/17/2021   CALCIUM 10.8 (H) 07/13/2021   CALCIUM 10.8 (H) 07/13/2021   CALCIUM 10.1 08/28/2019   CALCIUM 10.7 (H) 06/19/2019   CALCIUM 9.4 06/20/2010    The hypercalcemia is not associated with any pathologic fractures, renal insufficiency, nephrolithiasis, sarcoidosis, known carcinoma, overactive thyroid.  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 adenoma which was confirmed on CT of the parathyroid  Ultrasound showed 3.4 x 0.9 x 1.1 cm hypoechoic mass located posterior and inferior to the inferior tip of the left thyroid lobe is suspicious for parathyroid lesion  Prior serologic and radiologic studies have included:  Lab Results  Component Value Date   PTH 115 (H) 08/17/2021   CALCIUM 10.6 (H) 08/17/2021   CALCIUM 10.6 (H) 08/17/2021      ULTRASOUND IN 4/22: 3.4 x 0.9 x 1.1 cm hypoechoic mass located posterior and inferior to the inferior tip of the left thyroid lobe is suspicious for parathyroid lesion No parathyroid nuclear scan available  25 (OH) Vitamin D level as follows: This was done after she had been on  2000 units of vitamin D3 from her PCP  Lab Results  Component Value Date   VD25OH 29.16 (L) 08/17/2021    Prior treatment has included  Allergies as of 03/26/2022       Reactions   Morphine And Related Itching        Medication List        Accurate as of Mar 26, 2022  4:57 PM. If you have any questions, ask your nurse or doctor.          STOP taking these medications    furosemide 20 MG tablet Commonly known as: LASIX Stopped by: Elayne Snare, MD   losartan-hydrochlorothiazide 100-12.5 MG tablet Commonly known as: HYZAAR Stopped by: Elayne Snare, MD       TAKE these medications    amitriptyline 50 MG tablet Commonly known as: ELAVIL Take 100 mg by mouth daily.   aspirin EC 81 MG tablet Take 81 mg by mouth daily.   aspirin-acetaminophen-caffeine 250-250-65 MG tablet Commonly known as: EXCEDRIN MIGRAINE Take 2 tablets by mouth every 6 (six) hours as needed for headache.   cholecalciferol 25 MCG (1000 UNIT) tablet Commonly known as: VITAMIN D3   diltiazem 360 MG 24 hr capsule Commonly known as: CARDIZEM CD Take 1 capsule (360 mg total) by mouth daily.   gabapentin 300 MG capsule Commonly known as: NEURONTIN Take 600 mg by mouth at bedtime.   HAIR/SKIN/NAILS PO Take 1 tablet by mouth every evening.  hydrALAZINE 25 MG tablet Commonly known as: APRESOLINE Take 25 mg by mouth 2 (two) times daily.   ibuprofen 200 MG tablet Commonly known as: ADVIL Take 600 mg by mouth every 6 (six) hours as needed for headache or moderate pain.   metFORMIN 500 MG 24 hr tablet Commonly known as: GLUCOPHAGE-XR Take 500 mg by mouth daily.   penicillin v potassium 500 MG tablet Commonly known as: VEETID Take 500 mg by mouth 4 (four) times daily.   Potassium 99 MG Tabs Take 99 mg by mouth at bedtime.   rosuvastatin 10 MG tablet Commonly known as: CRESTOR Take 10 mg by mouth at bedtime.   spironolactone 25 MG tablet Commonly known as: ALDACTONE Take 25 mg by  mouth daily.        Allergies:  Allergies  Allergen Reactions   Morphine And Related Itching    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   Hypertension    Insomnia    MRSA infection 2018   stomach area - results not in EPIC   Obese    Prediabetes     Past Surgical History:  Procedure Laterality Date   ABDOMINAL HYSTERECTOMY N/A 07/08/2013   Procedure: HYSTERECTOMY ABDOMINAL;  Surgeon: Eldred Manges, MD;  Location: [facility];  Service: Gynecology;  Laterality: N/A;   BIOPSY  08/08/2020   Procedure: BIOPSY;  Surgeon: Rush Landmark Telford Nab., MD;  Location: [place];  Service: Gastroenterology;;   BREAST LUMPECTOMY WITH RADIOACTIVE SEED LOCALIZATION Right 09/02/2019   Procedure: RIGHT BREAST LUMPECTOMY WITH RADIOACTIVE SEED LOCALIZATION;  Surgeon: Fanny Skates, MD;  Location: [place];  Service: General;  Laterality: Right;   CESAREAN SECTION     COLONOSCOPY     COLONOSCOPY WITH PROPOFOL N/A 08/08/2020   Procedure: COLONOSCOPY WITH PROPOFOL;  Surgeon: Irving Copas., MD;  Location: [place];  Service: Gastroenterology;  Laterality: N/A;   ENDOSCOPIC MUCOSAL RESECTION N/A 08/03/2019   Procedure: ENDOSCOPIC MUCOSAL RESECTION;  Surgeon: Rush Landmark Telford Nab., MD;  Location: [place];  Service: Gastroenterology;  Laterality: N/A;   FLEXIBLE SIGMOIDOSCOPY N/A 08/03/2019   Procedure: FLEXIBLE SIGMOIDOSCOPY;  Surgeon: Rush Landmark Telford Nab., MD;  Location: [place];  Service: Gastroenterology;  Laterality: N/A;   HEMOSTASIS CLIP PLACEMENT  08/03/2019   Procedure: HEMOSTASIS CLIP PLACEMENT;  Surgeon: Irving Copas., MD;  Location: [place];  Service: Gastroenterology;;   POLYPECTOMY  08/08/2020   Procedure: POLYPECTOMY;  Surgeon: Irving Copas., MD;  Location:  [place];  Service: Gastroenterology;;   Clide Deutscher  08/03/2019   Procedure: Clide Deutscher;  Surgeon: Mansouraty, Telford Nab., MD;  Location: [place];  Service: Gastroenterology;;   SUBMUCOSAL LIFTING INJECTION  08/03/2019   Procedure: SUBMUCOSAL LIFTING INJECTION;  Surgeon: Irving Copas., MD;  Location: [place];  Service: Gastroenterology;;   SUBMUCOSAL LIFTING INJECTION  08/08/2020   Procedure: SUBMUCOSAL LIFTING INJECTION;  Surgeon: Irving Copas., MD;  Location: [place];  Service: Gastroenterology;;   SUBMUCOSAL TATTOO INJECTION  08/08/2020   Procedure: SUBMUCOSAL TATTOO INJECTION;  Surgeon: Irving Copas., MD;  Location: [facility];  Service: Gastroenterology;;   TOTAL ABDOMINAL HYSTERECTOMY  2014    Family History  Problem Relation Age of Onset   Heart disease Mother    Heart murmur Mother    Healthy Son  Colon cancer Neg Hx    Esophageal cancer Neg Hx    Inflammatory bowel disease Neg Hx    Liver disease Neg Hx    Pancreatic cancer Neg Hx    Rectal cancer Neg Hx    Stomach cancer Neg Hx    Hyperparathyroidism Neg Hx     Social History:  reports that she has never smoked. She has never used smokeless tobacco. She reports that she does not currently use alcohol. She reports that she does not use drugs.  Review of Systems  HYPERTENSION: This has been evaluated with catecholamines partly because of her baseline tachycardia Blood pressure initially was fairly well controlled when she was on Hyzaar No history of hypokalemia No secondary causes have been suspected Her PCP has been managing this now with better control recently Medications include Aldactone 25 mg daily  BP Readings from Last 3 Encounters:  03/26/22 132/82  12/29/21 (!) 180/90  11/23/21 (!) 158/92     DIABETES: This has been managed by her PCP with metformin and Victoza Her last A1c reportedly is 6.5, no reports available  EXAM:  BP 132/82 (Patient  Position: Standing)   Pulse (!) 109   Ht '5\' 1"'$  (1.549 m)   Wt 256 lb 12.8 oz (116.5 kg)   LMP 06/30/2013 (LMP Unknown)   SpO2 99%   BMI 48.52 kg/m   She has generalized obesity present  No ankle edema seen  Assessment/Plan:   HYPERCALCEMIA:  She has hyperparathyroidism which appears to be asymptomatic and associated with mild hypercalcemia However need to assess her recent calcium levels Also recheck vitamin D levels as she is on supplementation for reportedly a significantly low level   Discussed the nature of primary hyperparathyroidism as well as normal role of the parathyroid glands. Discussed potential  effects of hyperparathyroidism long-term on bone health, kidney stones and kidney function Explained to patient that surgery is indicated only there are symptoms of high calcium, calcium level over 1 point above the normal range or known osteoporosis.  Most likely considering her being in menopause recently and ethnic status she is unlikely to be having osteoporosis Will assess her urine calcium if serum calcium is significantly high  Follow-up to be decided  Elayne Snare 03/26/2022, 4:57 PM

## 2022-03-26 NOTE — Progress Notes (Deleted)
Patient ID: Paula Crawford, female   DOB: 02-Sep-1966, 56 y.o.   MRN: [id number]           Chief complaint:  History of Present Illness: 3.4 x 0.9 x 1.1 cm hypoechoic mass located posterior and inferior to the inferior tip of the left thyroid lobe is suspicious for parathyroid lesion  Ca  Lab Results  Component Value Date   PTH 115 (H) 08/17/2021   CALCIUM 10.6 (H) 08/17/2021   CALCIUM 10.6 (H) 08/17/2021    Vit D 172mg Allergies as of 03/26/2022       Reactions   Morphine And Related Itching        Medication List        Accurate as of Mar 26, 2022 11:38 AM. If you have any questions, ask your nurse or doctor.          aspirin EC 81 MG tablet Take 81 mg by mouth daily.   aspirin-acetaminophen-caffeine 250-250-65 MG tablet Commonly known as: EXCEDRIN MIGRAINE Take 2 tablets by mouth every 6 (six) hours as needed for headache.   diltiazem 360 MG 24 hr capsule Commonly known as: CARDIZEM CD Take 1 capsule (360 mg total) by mouth daily.   furosemide 20 MG tablet Commonly known as: LASIX Take 1 tablet (20 mg total) by mouth daily.   gabapentin 300 MG capsule Commonly known as: NEURONTIN Take 600 mg by mouth at bedtime.   HAIR/SKIN/NAILS PO Take 1 tablet by mouth every evening.   ibuprofen 200 MG tablet Commonly known as: ADVIL Take 600 mg by mouth every 6 (six) hours as needed for headache or moderate pain.   losartan-hydrochlorothiazide 100-12.5 MG tablet Commonly known as: HYZAAR Take 1 tablet by mouth daily.   metFORMIN 500 MG 24 hr tablet Commonly known as: GLUCOPHAGE-XR Take 500 mg by mouth daily.   penicillin v potassium 500 MG tablet Commonly known as: VEETID Take 500 mg by mouth 4 (four) times daily.   Potassium 99 MG Tabs Take 99 mg by mouth at bedtime.        Allergies:  Allergies  Allergen Reactions   Morphine And Related Itching    Past Medical History:  Diagnosis Date   Abnormality of right breast on screening  mammogram 09/02/2019   Anemia    Anxiety    Blood in stool    Heart murmur    newly dx 06/24/2013 leaking tricuspid valve   Hypertension    Insomnia    MRSA infection 2018   stomach area - results not in EPIC   Obese    Prediabetes     Past Surgical History:  Procedure Laterality Date   ABDOMINAL HYSTERECTOMY N/A 07/08/2013   Procedure: HYSTERECTOMY ABDOMINAL;  Surgeon: VEldred Manges MD;  [facility];  Service: Gynecology;  Laterality: N/A;   BIOPSY  08/08/2020   Procedure: BIOPSY;  Surgeon: MRush LandmarkGTelford Nab, MD;  [place] [facility]  Service: Gastroenterology;;   BREAST LUMPECTOMY WITH RADIOACTIVE SEED LOCALIZATION Right 09/02/2019   Procedure: RIGHT BREAST LUMPECTOMY WITH RADIOACTIVE SEED LOCALIZATION;  Surgeon: IFanny Skates MD;  [place] [facility]  Service: General;  Laterality: Right;   CESAREAN SECTION     COLONOSCOPY     COLONOSCOPY WITH PROPOFOL N/A 08/08/2020   Procedure: COLONOSCOPY WITH PROPOFOL;  Surgeon: MIrving Copas, MD;  [place] [facility]  Service: Gastroenterology;  Laterality: N/A;   ENDOSCOPIC MUCOSAL RESECTION N/A 08/03/2019   Procedure: ENDOSCOPIC MUCOSAL RESECTION;  Surgeon: MIrving Copas,  MD;  Location: [place];  Service: Gastroenterology;  Laterality: N/A;   FLEXIBLE SIGMOIDOSCOPY N/A 08/03/2019   Procedure: FLEXIBLE SIGMOIDOSCOPY;  Surgeon: Rush Landmark Telford Nab., MD;  Location: [place];  Service: Gastroenterology;  Laterality: N/A;   HEMOSTASIS CLIP PLACEMENT  08/03/2019   Procedure: HEMOSTASIS CLIP PLACEMENT;  Surgeon: Irving Copas., MD;  Location: [place];  Service: Gastroenterology;;   POLYPECTOMY  08/08/2020   Procedure: POLYPECTOMY;  Surgeon: Irving Copas., MD;  Location: [place];  Service: Gastroenterology;;   Clide Deutscher  08/03/2019   Procedure: Clide Deutscher;  Surgeon: Mansouraty, Telford Nab., MD;  Location: [place];  Service:  Gastroenterology;;   SUBMUCOSAL LIFTING INJECTION  08/03/2019   Procedure: SUBMUCOSAL LIFTING INJECTION;  Surgeon: Irving Copas., MD;  Location: [place];  Service: Gastroenterology;;   SUBMUCOSAL LIFTING INJECTION  08/08/2020   Procedure: SUBMUCOSAL LIFTING INJECTION;  Surgeon: Irving Copas., MD;  Location: [place];  Service: Gastroenterology;;   SUBMUCOSAL TATTOO INJECTION  08/08/2020   Procedure: SUBMUCOSAL TATTOO INJECTION;  Surgeon: Irving Copas., MD;  [facility];  Service: Gastroenterology;;   TOTAL ABDOMINAL HYSTERECTOMY  2014    Family History  Problem Relation Age of Onset   Heart disease Mother    Heart murmur Mother    Healthy Son    Colon cancer Neg Hx    Esophageal cancer Neg Hx    Inflammatory bowel disease Neg Hx    Liver disease Neg Hx    Pancreatic cancer Neg Hx    Rectal cancer Neg Hx    Stomach cancer Neg Hx    Hyperparathyroidism Neg Hx     Social History:  reports that she has never smoked. She has never used smokeless tobacco. She reports that she does not currently use alcohol. She reports that she does not use drugs.   No visits with results within 1 Week(s) from this visit.  Latest known visit with results is:  Office Visit on 09/19/2021  Component Date Value Ref Range Status   Epinephrine 09/19/2021 <20  pg/mL Final   Comment: . This test was developed and its analytical performance characteristics have been determined by [facility]. It has not been cleared or approved by FDA. This assay has been validated pursuant to the CLIA regulations and is used for clinical purposes.    Norepinephrine 09/19/2021 1,085  pg/mL Final   Comment: . This test was developed and its analytical performance characteristics have been determined by [facility]. It has not been cleared or approved by FDA. This assay has been  validated pursuant to the CLIA regulations and is used for clinical purposes.    Dopamine 09/19/2021 34 (H)  pg/mL Final   Comment: . This test was developed and its analytical performance characteristics have been determined by [facility]. It has not been cleared or approved by FDA. This assay has been validated pursuant to the CLIA regulations and is used for clinical purposes.    Total Catecholamines 09/19/2021 1,119  pg/mL Final   Comment: . Adult Reference Ranges for Catecholamines, Plasma . Epinephrine           Supine:  <50 pg/mL                       Upright: <95 pg/mL . Norepinephrine        Supine:  112-658 pg/mL  Upright: [phone] pg/mL . Dopamine              Supine:  <10 pg/mL                       Upright: <20 pg/mL . Total (N+E+D)         Supine:  123-671 pg/mL                       Upright: [phone] pg/mL . Pediatric Reference Ranges for Catecholamines, Plasma . Due to stress, plasma catecholamine levels are generally unreliable in infants and small children. Urinary catecholamine assays are more reliable. Marland Kitchen Epinephrine    3-15 Years         Supine:  < or = 464 pg/mL                       Upright: No Reference                                Range Available . Norepinephrine    3-15 Years         Supine:  < or = 1251 pg/mL                       Upright: No Reference                                Range Available . Dopamine    3-15 Years         Supine:  <60 p                          g/mL                       Upright: No Reference                                Range Available . Pediatric data from [facility] [phone]. . This test was developed and its analytical performance characteristics have been determined by [facility]. It has not been cleared or approved by FDA. This assay has been validated pursuant to the CLIA  regulations and is used for clinical purposes.    Metanephrine, Free 09/19/2021 48  <=57 pg/mL Final   Comment: . This test was developed and its analytical performance characteristics have been determined by [place], [place]. It has not been cleared or approved by the U.S. Food and Drug Administration. This assay has been validated pursuant to the CLIA regulations and is used for clinical purposes. Darol Destine, Free 09/19/2021 215 (H)  <=148 pg/mL Final   Comment: . This test was developed and its analytical performance characteristics have been determined by [place], [place]. It has not been cleared or approved by the U.S. Food and Drug Administration. This assay has been validated pursuant to the CLIA regulations and is used for clinical purposes. .    Total Metanephrines-Plasma 09/19/2021 263 (H)  <=205 pg/mL Final   Comment: . For additional information, please refer to http://education.questdiagnostics.com/faq/MetFractFree (This link is being provided for informational/educatio informational/educational purposes only.) . Elevations >4-fold  upper reference range: strongly suggestive of a pheochromocytoma(1). . Elevations >1- 4-fold upper reference range: significant but not diagnostic, may be due to medications or stress. Suggest running 24 hr urine fractionated metanephrines and/or serum Chromagranin A for confirmation. . Reference: . (1)Algeciras-Schimnich A et al, Plasma Chromogranin A or Urine Fractionated Metanephrines Follow-Up Testing Improves the Diagnostic Accuracy of Plasma Fractionated Metanephrines for Pheochromocytoma. The Journal of Clinical Endocrinology # Metabolism 93(1), 91-95, 2008. . . . This test was developed and its analytical performance characteristics have been determined by [place], [place]. It has not been cleared or a                           pproved by the U.S. Food and Drug Administration. This assay has been validated pursuant to the CLIA regulations and is used for clinical purposes. .    Iron 09/19/2021 57  42 - 145 ug/dL Final   Transferrin 09/19/2021 275.0  212.0 - 360.0 mg/dL Final   Saturation Ratios 09/19/2021 14.8 (L)  20.0 - 50.0 % Final   TIBC 09/19/2021 385.0  250.0 - 450.0 mcg/dL Final   WBC 09/19/2021 5.1  4.0 - 10.5 K/uL Final   RBC 09/19/2021 4.63  3.87 - 5.11 Mil/uL Final   Hemoglobin 09/19/2021 12.6  12.0 - 15.0 g/dL Final   HCT 09/19/2021 39.6  36.0 - 46.0 % Final   MCV 09/19/2021 85.6  78.0 - 100.0 fl Final   MCHC 09/19/2021 31.8  30.0 - 36.0 g/dL Final   RDW 09/19/2021 14.9  11.5 - 15.5 % Final   Platelets 09/19/2021 307.0  150.0 - 400.0 K/uL Final   Neutrophils Relative % 09/19/2021 53.2  43.0 - 77.0 % Final   Lymphocytes Relative 09/19/2021 37.6  12.0 - 46.0 % Final   Monocytes Relative 09/19/2021 6.8  3.0 - 12.0 % Final   Eosinophils Relative 09/19/2021 1.9  0.0 - 5.0 % Final   Basophils Relative 09/19/2021 0.5  0.0 - 3.0 % Final   Neutro Abs 09/19/2021 2.7  1.4 - 7.7 K/uL Final   Lymphs Abs 09/19/2021 1.9  0.7 - 4.0 K/uL Final   Monocytes Absolute 09/19/2021 0.4  0.1 - 1.0 K/uL Final   Eosinophils Absolute 09/19/2021 0.1  0.0 - 0.7 K/uL Final   Basophils Absolute 09/19/2021 0.0  0.0 - 0.1 K/uL Final    EXAM:  LMP 06/30/2013 (LMP Unknown)   Physical Exam  BP 132/82 (Patient Position: Standing)   Pulse (!) 109   Ht '5\' 1"'$  (1.549 m)   Wt 256 lb 12.8 oz (116.5 kg)   LMP 06/30/2013 (LMP Unknown)   SpO2 99%   BMI 48.52 kg/m   Assessment/Plan:    Elayne Snare 03/26/2022, 11:38 AM

## 2022-03-27 LAB — BASIC METABOLIC PANEL
BUN: 10 mg/dL (ref 6–23)
CO2: 28 mEq/L (ref 19–32)
Calcium: 11 mg/dL — ABNORMAL HIGH (ref 8.4–10.5)
Chloride: 100 mEq/L (ref 96–112)
Creatinine, Ser: 0.78 mg/dL (ref 0.40–1.20)
GFR: 85.39 mL/min (ref >60.00)
Glucose, Bld: 91 mg/dL (ref 70–99)
Potassium: 4.2 mEq/L (ref 3.5–5.1)
Sodium: 138 mEq/L (ref 135–145)

## 2022-03-27 LAB — VITAMIN D 25 HYDROXY (VIT D DEFICIENCY, FRACTURES): VITD: 36.05 ng/mL (ref 30.00–100.00)

## 2022-03-28 NOTE — Progress Notes (Signed)
Please call to let patient know that the calcium is only slightly increased, no further evaluation needed Vitamin D level normal

## 2023-03-13 ENCOUNTER — Encounter: Payer: Self-pay | Admitting: Internal Medicine

## 2023-03-13 ENCOUNTER — Ambulatory Visit: Payer: BC Managed Care – PPO | Admitting: Internal Medicine

## 2023-03-13 VITALS — BP 140/80 | HR 104 | Ht 61.0 in | Wt 267.0 lb

## 2023-03-13 DIAGNOSIS — E210 Primary hyperparathyroidism: Secondary | ICD-10-CM

## 2023-03-13 DIAGNOSIS — I10 Essential (primary) hypertension: Secondary | ICD-10-CM | POA: Diagnosis not present

## 2023-03-13 DIAGNOSIS — E010 Iodine-deficiency related diffuse (endemic) goiter: Secondary | ICD-10-CM

## 2023-03-13 NOTE — Patient Instructions (Signed)
Stay Hydrated  Avoid over the counter calcium tablets  Consume 2-3 servings of dietary calcium ( low fat dairy/ green leafy vegetables)    Please Contact [facility] office to schedule a bone density [phone] located at [address] [employer]

## 2023-03-13 NOTE — Progress Notes (Unsigned)
Name: Paula Crawford  MRN/ DOB: [id number], 03-19-1966    Age/ Sex: 57 y.o., female    PCP: Darrin Nipper, MD   Reason for Endocrinology Evaluation: Hypercalcemia      Date of Initial Endocrinology Evaluation: 07/13/2021    HPI: Paula Crawford is a 57 y.o. female with a past medical history of HTN. The patient presented for initial endocrinology clinic visit on 07/13/2021 for consultative assistance with her Hypercalcemia .   Ms. Zhai indicates that she was first diagnosed with hypercalcemia since 2020 with a max serum calcium of 11.0 mg/dL ( uncorrected ) 05/8294, with elevated PTH 115 pg/ml in 2022 .  She was initially seen by Dr. Everardo All in 2022, she was on HCTZ at the time which was attributed to the cause of hypercalcemia  She was evaluated by Dr. Lucianne Muss 03/2022 with slight elevation calcium was noted and no further evaluation was recommended    Today she does endorse symptoms of constipation, polyuria, polydipsia. She denies  use of over the counter calcium (including supplements, Tums, Rolaids, or other calcium containing antacids), lithium, HCTZ   She is on Vitamin D 1000 iu daily   She denies  history of kidney stones, kidney disease, liver disease, granulomatous disease. She denies  osteoporosis but has a right ankle fracture in 2011 and left foot 2023 ( fell down the steps )  .Daily dietary calcium intake: < 1 servings. She has  family history of thyroid disease ( maternal aunt) but no  osteoporosis, parathyroid disease    She was noted with uncontrolled HTN during her initial visit with Dr. Everardo All, and after reducing HCTZ, diltiazem was added by him Her metanephrines were slightly elevated at 263 PG/mL (reference <205) 09/2021 And attempted to intervention at the time was canceled   Thyroid ultrasound 02/2021 revealed 3.4 cm hypoechoic mass located posterior and inferior to the inferior tip of the left thyroid lobe suspicious for parathyroid  lesion.  She also had parathyroid 4D neck scan 02/2021 redemonstrating left inferior parathyroid adenoma        HISTORY:  Past Medical History:  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   Hypertension    Insomnia    MRSA infection 2018   stomach area - results not in EPIC   Obese    Prediabetes    Past Surgical History:  Past Surgical History:  Procedure Laterality Date   ABDOMINAL HYSTERECTOMY N/A 07/08/2013   Procedure: HYSTERECTOMY ABDOMINAL;  Surgeon: Hal Morales, MD;  [facility];  Service: Gynecology;  Laterality: N/A;   BIOPSY  08/08/2020   Procedure: BIOPSY;  Surgeon: Meridee Score Netty Starring., MD;  Location: [facility] ENDOSCOPY;  Service: Gastroenterology;;   BREAST LUMPECTOMY WITH RADIOACTIVE SEED LOCALIZATION Right 09/02/2019   Procedure: RIGHT BREAST LUMPECTOMY WITH RADIOACTIVE SEED LOCALIZATION;  Surgeon: Claud Kelp, MD;  Location: [facility];  Service: General;  Laterality: Right;   CESAREAN SECTION     COLONOSCOPY     COLONOSCOPY WITH PROPOFOL N/A 08/08/2020   Procedure: COLONOSCOPY WITH PROPOFOL;  Surgeon: Lemar Lofty., MD;  Location: [facility] END[facility]COPY;  Service: Gastroenterology;  Laterality: N/A;   ENDOSCOPIC MUCOSAL RESECTION N/A 08/03/2019   Procedure: ENDOSCOPIC MUCOSAL RESECTION;  Surgeon: Meridee Score Netty Starring., MD;  Location: [facility]OS[facility];  Service: Gastroenterology;  Laterality: N/A;   FLEXIBLE SIGMOIDOSCOPY N/A 08/03/2019   Procedure: FLEXIBLE SIGMOIDOSCOPY;  Surgeon: Meridee Score Netty Starring., MD;  Location: [facility] ENDOSCOPY;  Service: Gastroenterology;  Laterality: N/A;   HEMOSTASIS CLIP PLACEMENT  08/03/2019   Procedure: HEMOSTASIS CLIP PLACEMENT;  Surgeon: Lemar Lofty., MD;  Location: [facility] ENDOSCOPY;  Service: Gastroenterology;;   POLYPECTOMY  08/08/2020    Procedure: POLYPECTOMY;  Surgeon: Lemar Lofty., MD;  Location: [facility] ENDOSCOPY;  Service: Gastroenterology;;   Susa Day  08/03/2019   Procedure: Susa Day;  Surgeon: Lemar Lofty., MD;  Location: [facility] ENDOSCOPY;  Service: Gastroenterology;;   SUBMUCOSAL LIFTING INJECTION  08/03/2019   Procedure: SUBMUCOSAL LIFTING INJECTION;  Surgeon: Lemar Lofty., MD;  Location: [facility] ENDOSCOPY;  Service: Gastroenterology;;   SUBMUCOSAL LIFTING INJECTION  08/08/2020   Procedure: SUBMUCOSAL LIFTING INJECTION;  Surgeon: Lemar Lofty., MD;  Location: [facility] ENDOSCOPY;  Service: Gastroenterology;;   SUBMUCOSAL TATTOO INJECTION  08/08/2020   Procedure: SUBMUCOSAL TATTOO INJECTION;  Surgeon: Lemar Lofty., MD;  Location: [facility] ENDOSCOPY;  Service: Gastroenterology;;   TOTAL ABDOMINAL HYSTERECTOMY  2014    Social History:  reports that she has never smoked. She has never used smokeless tobacco. She reports that she does not currently use alcohol. She reports that she does not use drugs. Family History: family history includes Healthy in her son; Heart disease in her mother; Heart murmur in her mother.   HOME MEDICATIONS: Allergies as of 03/13/2023       Reactions   Morphine And Related Itching        Medication List        Accurate as of Mar 13, 2023  1:05 PM. If you have any questions, ask your nurse or doctor.          amitriptyline 50 MG tablet Commonly known as: ELAVIL Take 100 mg by mouth daily.   aspirin EC 81 MG tablet Take 81 mg by mouth daily.   aspirin-acetaminophen-caffeine 250-250-65 MG tablet Commonly known as: EXCEDRIN MIGRAINE Take 2 tablets by mouth every 6 (six) hours as needed for headache.   cholecalciferol 25 MCG (1000 UNIT) tablet Commonly known as: VITAMIN D3   diltiazem 360 MG 24 hr capsule Commonly known as: CARDIZEM CD Take 1 capsule (360 mg total) by mouth daily.   gabapentin 300 MG capsule Commonly known as:  NEURONTIN Take 600 mg by mouth at bedtime.   HAIR/SKIN/NAILS PO Take 1 tablet by mouth every evening.   hydrALAZINE 25 MG tablet Commonly known as: APRESOLINE Take 25 mg by mouth 2 (two) times daily.   ibuprofen 200 MG tablet Commonly known as: ADVIL Take 600 mg by mouth every 6 (six) hours as needed for headache or moderate pain.   metFORMIN 500 MG 24 hr tablet Commonly known as: GLUCOPHAGE-XR Take 500 mg by mouth daily.   Potassium 99 MG Tabs Take 99 mg by mouth at bedtime.   rosuvastatin 10 MG tablet Commonly known as: CRESTOR Take 10 mg by mouth at bedtime.   spironolactone 25 MG tablet Commonly known as: ALDACTONE Take 25 mg by mouth daily.          REVIEW OF SYSTEMS: A comprehensive ROS was conducted with the patient and is negative except as per HPI     OBJECTIVE:  VS: LMP 06/30/2013 (LMP Unknown)  BP (!) 140/80 (BP Location: Left Arm, Patient Position: Sitting, Cuff Size: Large)   Pulse (!) 104   Ht 5\' 1"  (1.549 m)   Wt 267 lb (  121.1 kg)   LMP 06/30/2013 (LMP Unknown)   SpO2 98%   BMI 50.45 kg/m   Wt Readings from Last 3 Encounters:  03/26/22 256 lb 12.8 oz (116.5 kg)  12/29/21 256 lb (116.1 kg)  11/23/21 251 lb 3.2 oz (113.9 kg)     EXAM: General: Pt appears well and is in NAD  Eyes: External eye exam normal without stare  Neck: General: Supple without adenopathy. Thyroid: Right thyromegaly noted  Lungs: Clear with good BS bilat   Heart: Auscultation: RRR.  Abdomen: soft, nontender, without masses or organomegaly palpable  Extremities:  BL LE: Trace  pretibial edema  Mental Status: Judgment, insight: Intact Orientation: Oriented to time, place, and person Mood and affect: No depression, anxiety, or agitation     DATA REVIEWED:  Latest Reference Range & Units 03/13/23 15:26  Sodium 135 - 145 mEq/L 140  Potassium 3.5 - 5.1 mEq/L 3.8  Chloride 96 - 112 mEq/L 102  CO2 19 - 32 mEq/L 26  Glucose 70 - 99 mg/dL 89  BUN 6 - 23 mg/dL 8   Creatinine 1.61 - 0.96 mg/dL 0.45  Calcium 8.4 - 40.9 mg/dL 81.1  Calcium Ionized  WILL FOLLOW (P)  Albumin 3.5 - 5.2 g/dL 4.3  GFR >91.47 mL/min 94.95    Latest Reference Range & Units 03/13/23 15:26  VITD 30.00 - 100.00 ng/mL 26.65 (L)  (L): Data is abnormally low   Latest Reference Range & Units 03/13/23 15:26  TSH 0.35 - 5.50 uIU/mL 1.61  T4,Free(Direct) 0.60 - 1.60 ng/dL 8.29    Old records , labs and images have been reviewed.    ASSESSMENT/PLAN/RECOMMENDATIONS:   Primary Hyperparathyroidism  -Serum calcium has normalized -Patient will need further testing to determine surgical candidacy to include 24-hour urine calcium and bone density scan -We will not be able to proceed with 24-hour urinary calcium excretion at this time, because the patient is on low calcium diet.  I have advised the patient to consume 2-3 servings of dietary calcium daily while avoiding OTC calcium supplements -Will proceed with 24-hour urine calcium by next visit -DXA scan has been ordered, patient wanted to schedule herself, information was provided for [place] office on [facility]   2. Thyromegaly :  -She has had thyroid ultrasound 2022 that did not reveal any thyroid nodules, on exam today the patient has been noted with right thyromegaly, will proceed with thyroid ultrasound -TFTs have been normal  3. HTN:  -Dr. Everardo All has been managing her hypertension that he noted after decreasing HCTZ due to hypercalcemia -She is currently on spironolactone -I would proceed with aldosterone/renin knowing that she is on spironolactone -In the future we do the 24-hour urine calcium I will also proceed with urinary cortisol  Follow-up in 4 months  Signed electronically by: Lyndle Herrlich, MD  [facility] Endocrinology  [facility] [address] [place] [place], [place] [address] Phone: [phone] FAX: [phone]   CC: Darrin Nipper, MD [address]  [place] [address] Phone: [phone] Fax: [phone]   Return to Endocrinology clinic as below: Future Appointments  Date Time Provider Department Center  03/13/2023  2:40 PM Marlana Mckowen, Konrad Dolores, MD [id number] None

## 2023-03-14 LAB — ALBUMIN: Albumin: 4.3 g/dL (ref 3.5–5.2)

## 2023-03-14 LAB — T4, FREE: Free T4: 0.82 ng/dL (ref 0.60–1.60)

## 2023-03-14 LAB — BASIC METABOLIC PANEL
BUN: 8 mg/dL (ref 6–23)
CO2: 26 mEq/L (ref 19–32)
Calcium: 10.5 mg/dL (ref 8.4–10.5)
Chloride: 102 mEq/L (ref 96–112)
Creatinine, Ser: 0.71 mg/dL (ref 0.40–1.20)
GFR: 94.95 mL/min (ref >60.00)
Glucose, Bld: 89 mg/dL (ref 70–99)
Potassium: 3.8 mEq/L (ref 3.5–5.1)
Sodium: 140 mEq/L (ref 135–145)

## 2023-03-14 LAB — ALDOSTERONE + RENIN ACTIVITY W/ RATIO

## 2023-03-14 LAB — TSH: TSH: 1.61 u[IU]/mL (ref 0.35–5.50)

## 2023-03-14 LAB — CALCIUM, IONIZED

## 2023-03-14 LAB — VITAMIN D 25 HYDROXY (VIT D DEFICIENCY, FRACTURES): VITD: 26.65 ng/mL — ABNORMAL LOW (ref 30.00–100.00)

## 2023-03-15 LAB — ALDOSTERONE + RENIN ACTIVITY W/ RATIO

## 2023-03-20 LAB — ALDOSTERONE + RENIN ACTIVITY W/ RATIO

## 2023-03-20 LAB — PARATHYROID HORMONE, INTACT (NO CA)

## 2023-07-18 ENCOUNTER — Encounter: Payer: Self-pay | Admitting: Internal Medicine

## 2023-07-18 ENCOUNTER — Ambulatory Visit: Payer: BC Managed Care – PPO | Admitting: Internal Medicine

## 2023-07-18 VITALS — BP 132/84 | HR 102 | Wt 268.0 lb

## 2023-07-18 DIAGNOSIS — I10 Essential (primary) hypertension: Secondary | ICD-10-CM | POA: Diagnosis not present

## 2023-07-18 DIAGNOSIS — E210 Primary hyperparathyroidism: Secondary | ICD-10-CM

## 2023-07-18 LAB — ALBUMIN: Albumin: 4 g/dL (ref 3.5–5.2)

## 2023-07-18 LAB — BASIC METABOLIC PANEL
BUN: 5 mg/dL — ABNORMAL LOW (ref 6–23)
CO2: 29 meq/L (ref 19–32)
Calcium: 10.4 mg/dL (ref 8.4–10.5)
Chloride: 103 meq/L (ref 96–112)
Creatinine, Ser: 0.7 mg/dL (ref 0.40–1.20)
GFR: 96.34 mL/min (ref >60.00)
Glucose, Bld: 98 mg/dL (ref 70–99)
Potassium: 4 meq/L (ref 3.5–5.1)
Sodium: 140 meq/L (ref 135–145)

## 2023-07-18 LAB — VITAMIN D 25 HYDROXY (VIT D DEFICIENCY, FRACTURES): VITD: 28.44 ng/mL — ABNORMAL LOW (ref 30.00–100.00)

## 2023-07-18 NOTE — Progress Notes (Addendum)
Name: Paula Crawford  MRN/ DOB: [id number], [birth year]    Age/ Sex: 57 y.o., female    PCP: Darrin Nipper, MD   Reason for Endocrinology Evaluation: Hypercalcemia      Date of Initial Endocrinology Evaluation: 07/13/2021    HPI: Paula Crawford is a 57 y.o. female with a past medical history of HTN. The patient presented for initial endocrinology clinic visit on 07/13/2021 for consultative assistance with her Hypercalcemia .   Paula Crawford indicates that she was first diagnosed with hypercalcemia since 2020 with a max serum calcium of 11.0 mg/dL ( uncorrected ) 06/6577, with elevated PTH 115 pg/ml in 2022 .  She was initially seen by Dr. Everardo All in 2022, she was on HCTZ at the time which was attributed to the cause of hypercalcemia  She was evaluated by Dr. Lucianne Muss 03/2022 with slight elevation calcium was noted and no further evaluation was recommended      She denies  history of kidney stones, kidney disease, liver disease, granulomatous disease. She denies  osteoporosis but has a right ankle fracture in 2011 and left foot 2023 ( fell down the steps )  .Daily dietary calcium intake: < 1 servings. She has  family history of thyroid disease ( maternal aunt) but no  osteoporosis, parathyroid disease   HTN HISTORY: She was noted with uncontrolled HTN during her initial visit with Dr. Everardo All, and after reducing HCTZ, diltiazem was added by him Her metanephrines were slightly elevated at 263 PG/mL (reference <205) 09/2021   Thyroid ultrasound 02/2021 revealed 3.4 cm hypoechoic mass located posterior and inferior to the inferior tip of the left thyroid lobe suspicious for parathyroid lesion.  She also had parathyroid 4D neck scan 02/2021 redemonstrating left inferior parathyroid adenoma  On her initial visit with me in May 2024 I ordered a bone density as well as thyroid ultrasound, but these were not done by her follow-up visit in 07/2023  SUBJECTIVE:    Today  (07/18/23):  Paula Crawford is here for follow-up on primary hyperparathyroidism.  She has not had   She stays hydrated  Has constipation,  She has polydipsia and polyuria  Denies arthralgia  She has not had her thyroid ultrasound no bone density yet Denies kidney stones   Vitamin D 1000 international unit daily    HISTORY:  Past Medical History:  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   Hypertension    Insomnia    MRSA infection 2018   stomach area - results not in EPIC   Obese    Prediabetes    Past Surgical History:  Past Surgical History:  Procedure Laterality Date   ABDOMINAL HYSTERECTOMY N/A 07/08/2013   Procedure: HYSTERECTOMY ABDOMINAL;  Surgeon: Hal Morales, MD;  Location: [facility];  Service: Gynecology;  Laterality: N/A;   BIOPSY  08/08/2020   Procedure: BIOPSY;  Surgeon: Meridee Score Netty Starring., MD;  Location: [facility] ENDOSCOPY;  Service: Gastroenterology;;   BREAST LUMPECTOMY WITH RADIOACTIVE SEED LOCALIZATION Right 09/02/2019   Procedure: RIGHT BREAST LUMPECTOMY WITH RADIOACTIVE SEED LOCALIZATION;  Surgeon: Claud Kelp, MD;  Location: [facility];  Service: General;  Laterality: Right;   CESAREAN SECTION     COLONOSCOPY     COLONOSCOPY WITH PROPOFOL N/A 08/08/2020   Procedure: COLONOSCOPY  WITH PROPOFOL;  Surgeon: Mansouraty, Netty Starring., MD;  Location: [facility] ENDOSCOPY;  Service: Gastroenterology;  Laterality: N/A;   ENDOSCOPIC MUCOSAL RESECTION N/A 08/03/2019   Procedure: ENDOSCOPIC MUCOSAL RESECTION;  Surgeon: Meridee Score Netty Starring., MD;  Location: [facility] ENDOSCOPY;  Service: Gastroenterology;  Laterality: N/A;   FLEXIBLE SIGMOIDOSCOPY N/A 08/03/2019   Procedure: FLEXIBLE SIGMOIDOSCOPY;  Surgeon: Meridee Score Netty Starring., MD;  Location: [facility] ENDOSCOPY;  Service: Gastroenterology;  Laterality: N/A;    HEMOSTASIS CLIP PLACEMENT  08/03/2019   Procedure: HEMOSTASIS CLIP PLACEMENT;  Surgeon: Lemar Lofty., MD;  Location: [facility] ENDOSCOPY;  Service: Gastroenterology;;   POLYPECTOMY  08/08/2020   Procedure: POLYPECTOMY;  Surgeon: Lemar Lofty., MD;  Location: [facility] ENDOSCOPY;  Service: Gastroenterology;;   Susa Day  08/03/2019   Procedure: Susa Day;  Surgeon: Lemar Lofty., MD;  Location: [facility] ENDOSCOPY;  Service: Gastroenterology;;   SUBMUCOSAL LIFTING INJECTION  08/03/2019   Procedure: SUBMUCOSAL LIFTING INJECTION;  Surgeon: Lemar Lofty., MD;  Location: [facility] ENDOSCOPY;  Service: Gastroenterology;;   SUBMUCOSAL LIFTING INJECTION  08/08/2020   Procedure: SUBMUCOSAL LIFTING INJECTION;  Surgeon: Lemar Lofty., MD;  Location: [facility] ENDOSCOPY;  Service: Gastroenterology;;   SUBMUCOSAL TATTOO INJECTION  08/08/2020   Procedure: SUBMUCOSAL TATTOO INJECTION;  Surgeon: Lemar Lofty., MD;  Location: [facility] ENDOSCOPY;  Service: Gastroenterology;;   TOTAL ABDOMINAL HYSTERECTOMY  2014    Social History:  reports that she has never smoked. She has never used smokeless tobacco. She reports that she does not currently use alcohol. She reports that she does not use drugs. Family History: family history includes Healthy in her son; Heart disease in her mother; Heart murmur in her mother.   HOME MEDICATIONS: Allergies as of 07/18/2023       Reactions   Morphine And Codeine Itching        Medication List        Accurate as of July 18, 2023 11:57 AM. If you have any questions, ask your nurse or doctor.          amitriptyline 50 MG tablet Commonly known as: ELAVIL Take 100 mg by mouth daily.   aspirin-acetaminophen-caffeine 250-250-65 MG tablet Commonly known as: EXCEDRIN MIGRAINE Take 2 tablets by mouth every 6 (six) hours as needed for headache.   cholecalciferol 25 MCG (1000 UNIT) tablet Commonly known as: VITAMIN D3   gabapentin 300 MG  capsule Commonly known as: NEURONTIN Take 600 mg by mouth at bedtime.   hydrALAZINE 50 MG tablet Commonly known as: APRESOLINE Take 50 mg by mouth in the morning and at bedtime.   ibuprofen 200 MG tablet Commonly known as: ADVIL Take 600 mg by mouth every 6 (six) hours as needed for headache or moderate pain.   metFORMIN 500 MG tablet Commonly known as: GLUCOPHAGE Take 1,000 mg by mouth daily with breakfast.   rosuvastatin 10 MG tablet Commonly known as: CRESTOR Take 10 mg by mouth at bedtime.   spironolactone 25 MG tablet Commonly known as: ALDACTONE Take 25 mg by mouth daily. 1 1/2 tab daily          REVIEW OF SYSTEMS: A comprehensive ROS was conducted with the patient and is negative except as per HPI     OBJECTIVE:  VS: BP 132/84 (BP Location: Left Arm, Patient Position: Sitting, Cuff Size: Large)   Pulse (!) 102   Wt 268 lb (121.6 kg)   LMP 06/30/2013 (LMP Unknown)   SpO2 99%   BMI 50.64 kg/m  BP 132/84 (BP Location: Left Arm, Patient  Position: Sitting, Cuff Size: Large)   Pulse (!) 102   Wt 268 lb (121.6 kg)   LMP 06/30/2013 (LMP Unknown)   SpO2 99%   BMI 50.64 kg/m   Wt Readings from Last 3 Encounters:  07/18/23 268 lb (121.6 kg)  03/13/23 267 lb (121.1 kg)  03/26/22 256 lb 12.8 oz (116.5 kg)     EXAM: General: Pt appears well and is in NAD  Neck: General: Supple without adenopathy. Thyroid: Right thyromegaly noted  Lungs: Clear with good BS bilat   Heart: Auscultation: RRR.  Extremities:  BL LE: Trace  pretibial edema  Mental Status: Judgment, insight: Intact Orientation: Oriented to time, place, and person Mood and affect: No depression, anxiety, or agitation     DATA REVIEWED:   Latest Reference Range & Units 07/18/23 12:22  Sodium 135 - 145 mEq/L 140  Potassium 3.5 - 5.1 mEq/L 4.0  Chloride 96 - 112 mEq/L 103  CO2 19 - 32 mEq/L 29  Glucose 70 - 99 mg/dL 98  BUN 6 - 23 mg/dL 5 (L)  Creatinine 5.95 - 1.20 mg/dL 6.38  Calcium 8.4  - 75.6 mg/dL 43.3  Albumin 3.5 - 5.2 g/dL 4.0  GFR >29.51 mL/min 96.34    Latest Reference Range & Units 07/18/23 12:22  VITD 30.00 - 100.00 ng/mL 28.44 (L)    Latest Reference Range & Units 07/18/23 12:22  PTH, Intact 16 - 77 pg/mL 104 (H)  (H): Data is abnormally high  Latest Reference Range & Units 07/18/23 12:22  Calcium Ionized 4.7 - 5.5 mg/dL 5.6 (H)  (H): Data is abnormally high   Thyroid Ultrasound 02/09/2021  Estimated total number of nodules >/= 1 cm: 0   Number of spongiform nodules >/=  2 cm not described below (TR1): 0   Number of mixed cystic and solid nodules >/= 1.5 cm not described below (TR2): 0   _________________________________________________________   The thyroid nodules are identified.   There is a 3.4 x 0.9 x 1.1 cm hypoechoic mass located posterior and inferior to the inferior tip of the left thyroid lobe suspicious for parathyroid lesion.   Mildly prominent left lateral neck lymph node measures 0.6 cm in short axis.   IMPRESSION: 3.4 x 0.9 x 1.1 cm hypoechoic mass located posterior and inferior to the inferior tip of the left thyroid lobe is suspicious for parathyroid lesion. Correlation with soft tissue neck CT should be considered.   CT parathyroid for the neck 02/2021 confirmed parathyroid adenoma    Old records , labs and images have been reviewed.    ASSESSMENT/PLAN/RECOMMENDATIONS:   Primary Hyperparathyroidism  -Encouraged hydration -She has not scheduled DXA scan, she was given the information again today in office, patient opts to schedule on her own appointment as she has a busy schedule -It does not seem that she is consistent with dietary calcium intake, but I will proceed with 24-hour urine collection for calcium excretion  -Serum calcium is normal, PTH elevated, ionized calcium elevated as well  2. Thyromegaly :  -She has had thyroid ultrasound 2022 that did not reveal any thyroid nodules -I had ordered a repeat thyroid  ultrasound in May 2024 due to right thyroid asymmetry -This has not been done, patient is busy and has not scheduled her bone density yet, will monitor clinically -Will consider ordering another thyroid ultrasound if I notice a difference on exam  3. HTN:  -Dr. Everardo All has been managing her hypertension that he noted after decreasing HCTZ due to hypercalcemia -  She is currently on spironolactone -I would proceed with aldosterone/renin as well as 24-hour urinary cortisol   4.  Vitamin D insufficiency:  -Vitamin D continues to be low, I question compliance -Will increase vitamin D to 2000 international unit daily   Follow-up in 6 months  Signed electronically by: Lyndle Herrlich, MD  [facility] Endocrinology  [facility] [address] [place] [place], [place] [address] Phone: [phone] FAX: [phone]   CC: Darrin Nipper, MD [address] [place] [address] Phone: [phone] Fax: [phone]   Return to Endocrinology clinic as below: No future appointments.

## 2023-07-18 NOTE — Patient Instructions (Addendum)
Stay Hydrated  Avoid over the counter calcium tablets  Consume 2-3 servings of dietary calcium ( low fat dairy/ green leafy vegetables)    Please Contact [facility] office to schedule a bone density [phone] located at [address] [place]    24-Hour Urine Collection  You will be collecting your urine for a 24-hour period of time. Your timer starts with your first urine of the morning (For example - If you first pee at 9AM, your timer will start at 9AM) Throw away your first urine of the morning Collect your urine every time you pee for the next 24 hours STOP your urine collection 24 hours after you started the collection (For example - You would stop at 9AM the day after you started)

## 2023-07-19 ENCOUNTER — Telehealth: Payer: Self-pay | Admitting: Internal Medicine

## 2023-07-19 NOTE — Telephone Encounter (Signed)
LMTCB and sent mychart message.

## 2023-07-19 NOTE — Telephone Encounter (Signed)
Please let the patient know that her kidney function remains normal as well as her calcium but her parathyroid hormone remains elevated.  Please let the patient know vitamin D remains low and she needs to increase vitamin D to 2000 international unit daily   Thanks

## 2023-07-22 NOTE — Telephone Encounter (Signed)
Patient is aware and verbalized understanding.

## 2023-07-27 LAB — ALDOSTERONE + RENIN ACTIVITY W/ RATIO
ALDO / PRA Ratio: 2.1 Ratio (ref 0.9–28.9)
Aldosterone: 4 ng/dL
Renin Activity: 1.93 ng/mL/h (ref 0.25–5.82)

## 2023-07-27 LAB — CALCIUM, IONIZED: Calcium, Ion: 5.6 mg/dL — ABNORMAL HIGH (ref 4.7–5.5)

## 2023-07-27 LAB — PARATHYROID HORMONE, INTACT (NO CA): PTH: 104 pg/mL — ABNORMAL HIGH (ref 16–77)

## 2023-08-06 ENCOUNTER — Ambulatory Visit (INDEPENDENT_AMBULATORY_CARE_PROVIDER_SITE_OTHER)
Admission: RE | Admit: 2023-08-06 | Discharge: 2023-08-06 | Disposition: A | Discharge status: Home / Self Care | Payer: BC Managed Care – PPO | Source: Ambulatory Visit | Attending: Internal Medicine | Admitting: Internal Medicine

## 2023-08-06 DIAGNOSIS — E210 Primary hyperparathyroidism: Secondary | ICD-10-CM | POA: Diagnosis not present

## 2023-08-22 DIAGNOSIS — E210 Primary hyperparathyroidism: Secondary | ICD-10-CM | POA: Diagnosis not present

## 2023-09-23 ENCOUNTER — Encounter: Payer: Self-pay | Admitting: Gastroenterology

## 2023-11-18 ENCOUNTER — Other Ambulatory Visit: Payer: BC Managed Care – PPO

## 2023-11-18 ENCOUNTER — Other Ambulatory Visit: Payer: Self-pay | Admitting: Internal Medicine

## 2023-11-18 DIAGNOSIS — I10 Essential (primary) hypertension: Secondary | ICD-10-CM

## 2023-11-25 ENCOUNTER — Other Ambulatory Visit: Payer: BC Managed Care – PPO

## 2023-11-26 ENCOUNTER — Encounter: Payer: Self-pay | Admitting: Internal Medicine

## 2023-12-05 LAB — CORTISOL, URINE, 24 HOUR
24 Hour urine volume (VMAHVA): 1500 mL
CREATININE, URINE: 1.51 g/(24.h) (ref 0.50–2.15)
Cortisol (Ur), Free: 16.3 ug/(24.h) (ref 4.0–50.0)

## 2023-12-05 LAB — CALCIUM, URINE, 24 HOUR: Calcium, 24H Urine: 137 mg/(24.h)

## 2023-12-06 ENCOUNTER — Ambulatory Visit
Admission: RE | Admit: 2023-12-06 | Discharge: 2023-12-06 | Disposition: A | Discharge status: Home / Self Care | Payer: BC Managed Care – PPO | Source: Ambulatory Visit | Attending: Internal Medicine | Admitting: Internal Medicine

## 2023-12-06 DIAGNOSIS — E010 Iodine-deficiency related diffuse (endemic) goiter: Secondary | ICD-10-CM

## 2023-12-09 ENCOUNTER — Encounter: Payer: Self-pay | Admitting: Internal Medicine

## 2024-01-16 ENCOUNTER — Ambulatory Visit: Payer: BC Managed Care – PPO | Admitting: Internal Medicine

## 2024-01-16 ENCOUNTER — Encounter: Payer: Self-pay | Admitting: Internal Medicine

## 2024-01-16 VITALS — BP 126/82 | HR 103 | Ht 61.0 in | Wt 255.0 lb

## 2024-01-16 DIAGNOSIS — E210 Primary hyperparathyroidism: Secondary | ICD-10-CM

## 2024-01-16 DIAGNOSIS — M8580 Other specified disorders of bone density and structure, unspecified site: Secondary | ICD-10-CM | POA: Diagnosis not present

## 2024-01-16 DIAGNOSIS — I10 Essential (primary) hypertension: Secondary | ICD-10-CM

## 2024-01-16 MED ORDER — SPIRONOLACTONE 25 MG PO TABS: 25.0000 mg | ORAL_TABLET | Freq: Every day | ORAL | 3 refills | Status: AC

## 2024-01-16 NOTE — Progress Notes (Unsigned)
 Name: Paula Crawford  MRN/ DOB: [id number], [birth year]    Age/ Sex: 58 y.o., female    PCP: Darrin Nipper, MD   Reason for Endocrinology Evaluation: Hypercalcemia      Date of Initial Endocrinology Evaluation: 07/13/2021    HPI: Paula Crawford is a 58 y.o. female with a past medical history of HTN. The patient presented for initial endocrinology clinic visit on 07/13/2021 for consultative assistance with her Hypercalcemia .   Paula Crawford indicates that she was first diagnosed with hypercalcemia since 2020 with a max serum calcium of 11.0 mg/dL ( uncorrected ) 01/864, with elevated PTH 115 pg/ml in 2022 .  She was initially seen by Dr. Everardo All in 2022, she was on HCTZ at the time which was attributed to the cause of hypercalcemia  She was evaluated by Dr. Lucianne Muss 03/2022 with slight elevation calcium was noted and no further evaluation was recommended      She denies  history of kidney stones, kidney disease, liver disease, granulomatous disease. She denies  osteoporosis but has a right ankle fracture in 2011 and left foot 2023 ( fell down the steps )  .Daily dietary calcium intake: < 1 servings. She has  family history of thyroid disease ( maternal aunt) but no  osteoporosis, parathyroid disease    24-hour urinary calcium was normal at 137 11/2023   HTN HISTORY: She was noted with uncontrolled HTN during her initial visit with Dr. Everardo All, and after reducing HCTZ, diltiazem was added by him Her metanephrines were slightly elevated at 263 PG/mL (reference <205) 09/2021   Thyroid ultrasound 02/2021 revealed 3.4 cm hypoechoic mass located posterior and inferior to the inferior tip of the left thyroid lobe suspicious for parathyroid lesion.  She also had parathyroid 4D neck scan 02/2021 redemonstrating left inferior parathyroid adenoma   24-hour urinary cortisol came back normal at 16.3 11/2022  Aldo, renin, Aldo/PRA ratio also normal 07/2023  SUBJECTIVE:     Today (01/16/24):  Paula Crawford is here for follow-up on primary hyperparathyroidism and HTN.   Denies headaches or vision changes  Has occasional constipation  Denies polydipsia, no frequency  Denies LE edema  Denies kidney stones   Vitamin D 2000 international unit daily    HISTORY:  Past Medical History:  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   Hypertension    Insomnia    MRSA infection 2018   stomach area - results not in EPIC   Obese    Prediabetes    Past Surgical History:  Past Surgical History:  Procedure Laterality Date   ABDOMINAL HYSTERECTOMY N/A 07/08/2013   Procedure: HYSTERECTOMY ABDOMINAL;  Surgeon: Hal Morales, MD;  Location: [facility];  Service: Gynecology;  Laterality: N/A;   BIOPSY  08/08/2020   Procedure: BIOPSY;  Surgeon: Meridee Score Netty Starring., MD;  Location: [facility] ENDOSCOPY;  Service: Gastroenterology;;   BREAST LUMPECTOMY WITH RADIOACTIVE SEED LOCALIZATION Right 09/02/2019   Procedure: RIGHT BREAST LUMPECTOMY WITH RADIOACTIVE SEED LOCALIZATION;  Surgeon: Claud Kelp, MD;  Location: [facility];  Service: General;  Laterality: Right;   CESAREAN SECTION     COLONOSCOPY     COLONOSCOPY WITH PROPOFOL N/A 08/08/2020   Procedure: COLONOSCOPY WITH PROPOFOL;  Surgeon: Lemar Lofty., MD;  Location: [facility] ENDOSCOPY;  Service: Gastroenterology;  Laterality: N/A;   ENDOSCOPIC MUCOSAL RESECTION N/A 08/03/2019   Procedure: ENDOSCOPIC MUCOSAL RESECTION;  Surgeon: Meridee Score Netty Starring., MD;  Location: [facility] ENDOSCOPY;  Service: Gastroenterology;  Laterality: N/A;   FLEXIBLE SIGMOIDOSCOPY N/A 08/03/2019   Procedure: FLEXIBLE SIGMOIDOSCOPY;  Surgeon: Meridee Score Netty Starring., MD;  Location: [facility] ENDOSCOPY;  Service: Gastroenterology;  Laterality: N/A;   HEMOSTASIS CLIP PLACEMENT   08/03/2019   Procedure: HEMOSTASIS CLIP PLACEMENT;  Surgeon: Lemar Lofty., MD;  Location: [facility] ENDOSCOPY;  Service: Gastroenterology;;   POLYPECTOMY  08/08/2020   Procedure: POLYPECTOMY;  Surgeon: Lemar Lofty., MD;  Location: [facility] ENDOSCOPY;  Service: Gastroenterology;;   Susa Day  08/03/2019   Procedure: Susa Day;  Surgeon: Lemar Lofty., MD;  Location: [facility] ENDOSCOPY;  Service: Gastroenterology;;   SUBMUCOSAL LIFTING INJECTION  08/03/2019   Procedure: SUBMUCOSAL LIFTING INJECTION;  Surgeon: Lemar Lofty., MD;  Location: [facility] END[facility]COPY;  Service: Gastroenterology;;   SUBMUCOSAL LIFTING INJECTION  08/08/2020   Procedure: SUBMUCOSAL LIFTING INJECTION;  Surgeon: Lemar Lofty., MD;  Location: [facility] ENDOSCOPY;  Service: Gastroenterology;;   SUBMUCOSAL TATTOO INJECTION  08/08/2020   Procedure: SUBMUCOSAL TATTOO INJECTION;  Surgeon: Lemar Lofty., MD;  Location: [facility] ENDOSCOPY;  Service: Gastroenterology;;   TOTAL ABDOMINAL HYSTERECTOMY  2014    Social History:  reports that she has never smoked. She has never used smokeless tobacco. She reports that she does not currently use alcohol. She reports that she does not use drugs. Family History: family history includes Healthy in her son; Heart disease in her mother; Heart murmur in her mother.   HOME MEDICATIONS: Allergies as of 01/16/2024       Reactions   Morphine And Codeine Itching        Medication List        Accurate as of January 16, 2024  7:42 AM. If you have any questions, ask your nurse or doctor.          amitriptyline 50 MG tablet Commonly known as: ELAVIL Take 100 mg by mouth daily.   aspirin EC 81 MG tablet as needed.   aspirin-acetaminophen-caffeine 250-250-65 MG tablet Commonly known as: EXCEDRIN MIGRAINE Take 2 tablets by mouth every 6 (six) hours as needed for headache.   cholecalciferol 25 MCG (1000 UNIT) tablet Commonly known as: VITAMIN D3   gabapentin  300 MG capsule Commonly known as: NEURONTIN Take 600 mg by mouth at bedtime.   glucose blood test strip Take 1 strip every day by miscell. route.   hydrALAZINE 50 MG tablet Commonly known as: APRESOLINE Take 50 mg by mouth in the morning and at bedtime.   ibuprofen 200 MG tablet Commonly known as: ADVIL Take 600 mg by mouth every 6 (six) hours as needed for headache or moderate pain.   metFORMIN 500 MG tablet Commonly known as: GLUCOPHAGE Take 1,000 mg by mouth daily with breakfast.   rosuvastatin 10 MG tablet Commonly known as: CRESTOR Take 10 mg by mouth at bedtime.   spironolactone 25 MG tablet Commonly known as: ALDACTONE Take 25 mg by mouth daily. 1 1/2 tab daily          REVIEW OF SYSTEMS: A comprehensive ROS was conducted with the patient and is negative except as per HPI     OBJECTIVE:  VS: BP 126/82 (BP Location: Left Arm, Patient Position: Sitting, Cuff Size: Small)   Pulse (!) 103   Ht 5\' 1"  (1.549 m)   Wt 255 lb (115.7 kg)   LMP 06/30/2013 (LMP  Unknown)   SpO2 99%   BMI 48.18 kg/m  BP 126/82 (BP Location: Left Arm, Patient Position: Sitting, Cuff Size: Small)   Pulse (!) 103   Ht 5\' 1"  (1.549 m)   Wt 255 lb (115.7 kg)   LMP 06/30/2013 (LMP Unknown)   SpO2 99%   BMI 48.18 kg/m   Wt Readings from Last 3 Encounters:  01/16/24 255 lb (115.7 kg)  07/18/23 268 lb (121.6 kg)  03/13/23 267 lb (121.1 kg)     EXAM: General: Pt appears well and is in NAD  Neck: General: Supple without adenopathy. Thyroid: Right thyromegaly noted  Lungs: Clear with good BS bilat   Heart: Auscultation: RRR.  Extremities:  BL LE: Trace  pretibial edema  Mental Status: Judgment, insight: Intact Orientation: Oriented to time, place, and person Mood and affect: No depression, anxiety, or agitation     DATA REVIEWED:    Latest Reference Range & Units 01/16/24 08:02  Sodium 135 - 146 mmol/L 140  Potassium 3.5 - 5.3 mmol/L 4.3  Chloride 98 - 110 mmol/L 103   CO2 20 - 32 mmol/L 29  Glucose 65 - 99 mg/dL 536 (H)  BUN 7 - 25 mg/dL 8  Creatinine 6.44 - 0.34 mg/dL 7.42  Calcium 8.6 - 59.5 mg/dL 63.8 (H)  BUN/Creatinine Ratio 6 - 22 (calc) SEE NOTE:  eGFR > OR = 60 mL/min/1.75m2 94     Latest Reference Range & Units 01/16/24 08:02  Vitamin D, 25-Hydroxy 30 - 100 ng/mL 39      Thyroid Ultrasound 12/06/2023  Estimated total number of nodules >/= 1 cm: 0   Number of spongiform nodules >/=  2 cm not described below (TR1): 0   Number of mixed cystic and solid nodules >/= 1.5 cm not described below (TR2): 0   _________________________________________________________   No discrete nodules are identified within the thyroid gland.   Redemonstrated well-defined 1.1 x 0.6 x 0.8 cm hypoechoic nodule adjacent to the mid, posterior aspect of the right lobe of the thyroid as well as a 3.0 x 1.4 x 1.0 cm hypoechoic nodule adjacent to the inferior pole of the left lobe of the thyroid, unchanged compared to contrast-enhanced neck CT performed 03/03/2021.   IMPRESSION: 1. Slightly atrophic but otherwise normal-appearing thyroid without discrete nodule or mass. 2. Redemonstrated 1.0 cm hypoechoic nodule adjacent to the mid, posterior aspect of the right lobe of the thyroid as well as a 3.0 cm hypoechoic nodule adjacent to the inferior pole of the left lobe of the thyroid, unchanged compared to contrast-enhanced neck CT performed 03/03/2021. While potentially representative of the cervical lymph nodes, given provided history of hyperparathyroidism, these nodules may represent parathyroid adenomas. Further evaluation with nuclear medicine parathyroid scintigraphy could be performed as indicated.     CT parathyroid for the neck 02/2021 confirmed parathyroid adenoma  DXA 08/06/2023    Results:   Lumbar spine L1-L4 Femoral neck (FN) 33% distal radius  T-score +0.8 RFN: -1.4 LFN: -0.7 -0.1      Assessment: By the [facility] Criteria for diagnosis  based on bone density, this patient has Low Bone Density       Old records , labs and images have been reviewed.    ASSESSMENT/PLAN/RECOMMENDATIONS:   Primary Hyperparathyroidism    -DXA scan-osteopenia 08/2023 -Normal 24-hour urinary calcium excretion 137 in 11/2023 -She does not meet criteria for surgical intervention  -Serum calcium 10.6 Mg/DL, corrected is normal at 10.36 Mg/DL, GFR normal   Recommendations Stay hydrated Avoid  over-the-counter calcium tablets Consume 2-3 servings of dietary calcium daily   2. HTN:  -Dr. Everardo All has been managing her hypertension that he noted after decreasing HCTZ due to hypercalcemia -She is currently on spironolactone - BP well controlled    3.  Vitamin D insufficiency:  -Vitamin D normal Continue Vitamin D  2000 international unit daily   4. Osteopenia :  - Emphasized the importance of calcium intake through dietary intake of 2-3 servings   Follow-up in 1 yr   Signed electronically by: Lyndle Herrlich, MD  [facility] Endocrinology  [facility] [address] [place] [place], [place] [address] Phone: [phone] FAX: [phone]   CC: Darrin Nipper, MD [address] [place] [address] Phone: [phone] Fax: [phone]   Return to Endocrinology clinic as below: No future appointments.

## 2024-01-16 NOTE — Patient Instructions (Signed)
 Stay Hydrated  Avoid over the counter calcium tablets  Consume 2-3 servings of dietary calcium ( low fat dairy/ green leafy vegetables)

## 2024-01-17 ENCOUNTER — Encounter: Payer: Self-pay | Admitting: Internal Medicine

## 2024-01-17 LAB — BASIC METABOLIC PANEL WITH GFR
BUN: 8 mg/dL (ref 7–25)
CO2: 29 mmol/L (ref 20–32)
Calcium: 10.6 mg/dL — ABNORMAL HIGH (ref 8.6–10.4)
Chloride: 103 mmol/L (ref 98–110)
Creat: 0.74 mg/dL (ref 0.50–1.03)
Glucose, Bld: 111 mg/dL — ABNORMAL HIGH (ref 65–99)
Potassium: 4.3 mmol/L (ref 3.5–5.3)
Sodium: 140 mmol/L (ref 135–146)
eGFR: 94 mL/min/{1.73_m2} (ref >=60)

## 2024-01-17 LAB — VITAMIN D 25 HYDROXY (VIT D DEFICIENCY, FRACTURES): Vit D, 25-Hydroxy: 39 ng/mL (ref 30–100)

## 2024-01-17 LAB — PARATHYROID HORMONE, INTACT (NO CA): PTH: 96 pg/mL — ABNORMAL HIGH (ref 16–77)

## 2024-01-17 LAB — ALBUMIN: Albumin: 4.3 g/dL (ref 3.6–5.1)

## 2024-05-27 ENCOUNTER — Other Ambulatory Visit: Payer: Self-pay | Admitting: Internal Medicine

## 2024-05-27 DIAGNOSIS — N644 Mastodynia: Secondary | ICD-10-CM

## 2024-06-05 ENCOUNTER — Ambulatory Visit
Admission: RE | Admit: 2024-06-05 | Discharge: 2024-06-05 | Disposition: A | Discharge status: Home / Self Care | Source: Ambulatory Visit | Attending: Internal Medicine | Admitting: Internal Medicine

## 2024-06-05 DIAGNOSIS — N644 Mastodynia: Secondary | ICD-10-CM

## 2025-01-14 ENCOUNTER — Ambulatory Visit: Admitting: Internal Medicine
